# Patient Record
Sex: Female | Born: 1970 | Hispanic: Yes | Marital: Married | Smoking: Never smoker
Health system: Southern US, Community
[De-identification: ages and names within clinical notes are randomized; demographics above are authoritative.]

## PROBLEM LIST (undated history)

## (undated) DIAGNOSIS — D509 Iron deficiency anemia, unspecified: Secondary | ICD-10-CM

## (undated) DIAGNOSIS — Z9889 Other specified postprocedural states: Secondary | ICD-10-CM

## (undated) DIAGNOSIS — E119 Type 2 diabetes mellitus without complications: Secondary | ICD-10-CM

## (undated) DIAGNOSIS — K5010 Crohn's disease of large intestine without complications: Secondary | ICD-10-CM

## (undated) DIAGNOSIS — I10 Essential (primary) hypertension: Secondary | ICD-10-CM

## (undated) DIAGNOSIS — R112 Nausea with vomiting, unspecified: Secondary | ICD-10-CM

## (undated) HISTORY — PX: APPENDECTOMY: SHX54

## (undated) HISTORY — PX: CHOLECYSTECTOMY: SHX55

---

## 2020-12-02 ENCOUNTER — Emergency Department (HOSPITAL_COMMUNITY)
Admission: EM | Admit: 2020-12-02 | Discharge: 2020-12-02 | Disposition: A | Discharge status: Home / Self Care | Payer: 59 | Attending: Emergency Medicine | Admitting: Emergency Medicine

## 2020-12-02 ENCOUNTER — Other Ambulatory Visit: Payer: Self-pay

## 2020-12-02 ENCOUNTER — Emergency Department (HOSPITAL_COMMUNITY): Payer: 59

## 2020-12-02 ENCOUNTER — Encounter (HOSPITAL_COMMUNITY): Payer: Self-pay

## 2020-12-02 DIAGNOSIS — I10 Essential (primary) hypertension: Secondary | ICD-10-CM | POA: Insufficient documentation

## 2020-12-02 DIAGNOSIS — R519 Headache, unspecified: Secondary | ICD-10-CM | POA: Insufficient documentation

## 2020-12-02 LAB — BASIC METABOLIC PANEL
Anion gap: 9 (ref 5–15)
BUN: 11 mg/dL (ref 6–20)
CO2: 26 mmol/L (ref 22–32)
Calcium: 9.3 mg/dL (ref 8.9–10.3)
Chloride: 105 mmol/L (ref 98–111)
Creatinine, Ser: 0.88 mg/dL (ref 0.44–1.00)
GFR, Estimated: >60 mL/min (ref >60)
Glucose, Bld: 98 mg/dL (ref 70–99)
Potassium: 4 mmol/L (ref 3.5–5.1)
Sodium: 140 mmol/L (ref 135–145)

## 2020-12-02 LAB — CBC
HCT: 39.8 % (ref 36.0–46.0)
Hemoglobin: 13.5 g/dL (ref 12.0–15.0)
MCH: 28.2 pg (ref 26.0–34.0)
MCHC: 33.9 g/dL (ref 30.0–36.0)
MCV: 83.3 fL (ref 80.0–100.0)
Platelets: 362 10*3/uL (ref 150–400)
RBC: 4.78 MIL/uL (ref 3.87–5.11)
RDW: 14 % (ref 11.5–15.5)
WBC: 7.1 10*3/uL (ref 4.0–10.5)
nRBC: 0 % (ref 0.0–0.2)

## 2020-12-02 MED ORDER — AMLODIPINE BESYLATE 5 MG PO TABS: 5.0000 mg | ORAL_TABLET | Freq: Every day | ORAL | 1 refills | Status: DC

## 2020-12-02 MED ORDER — AMLODIPINE BESYLATE 5 MG PO TABS
5.0000 mg | ORAL_TABLET | Freq: Once | ORAL | Status: AC
  Administered 2020-12-02: 5 mg via ORAL
  Filled 2020-12-02: qty 1

## 2020-12-02 NOTE — ED Triage Notes (Signed)
Pt here for hypertension, no previous hx of htn. States she was supposed to get a tooth pulled yesterday and dentist would not do it d/t htn. Pt c/o mild sob

## 2020-12-02 NOTE — ED Provider Notes (Signed)
Karen Hebert   CSN: [id number] Arrival date & time: 12/02/20  1016     History Chief Complaint  Patient presents with  . Hypertension    Karen Hebert is a 50 y.o. female.  The history is provided by the patient, the spouse and medical records.  Karen Hebert is a 50 y.o. female who presents to the Emergency Department complaining of hypertension. She presents the emergency department accompanied by her husband for evaluation of hypertension. She sought an ophthalmologist last year and was told that she had some changes to her right eye and was scheduled to follow-up with a primary care provider and then she ended up moving and was unable to follow-up with the primary care provider. She believes that these were vessel changes to her eye. Yesterday she went to the dentist to have her tooth pulled when they checked her blood pressure and it was found to be high and she was recommended to get evaluated for hyper- pressure. She does report intermittent right-sided headaches that have been ongoing for several months, none currently. She also reports one week of occasional chest tightness, not currently. No vomiting, diarrhea, leg swelling or pain. She takes no medications.     History reviewed. No pertinent past medical history.  There are no problems to display for this patient.   Past Surgical History:  Procedure Laterality Date  . APPENDECTOMY    . CHOLECYSTECTOMY       OB History   No obstetric history on file.     No family history on file.  Social History   Tobacco Use  . Smoking status: Never Smoker  . Smokeless tobacco: Never Used  Substance Use Topics  . Alcohol use: Not Currently  . Drug use: Not Currently    Home Medications Prior to Admission medications   Medication Sig Start Date End Date Taking? Authorizing Provider  amLODipine (NORVASC) 5 MG tablet Take 1 tablet (5 mg total) by mouth daily. 12/02/20   Yes Tilden Fossa, MD    Allergies    Penicillins  Review of Systems   Review of Systems  All other systems reviewed and are negative.   Physical Exam Updated Vital Signs BP (!) 157/106   Pulse 64   Temp 98.2 F (36.8 C) (Oral)   Resp 18   Ht 5\' 9"  (1.753 m)   Wt 102.1 kg   SpO2 100%   BMI 33.23 kg/m   Physical Exam Vitals and nursing Hebert reviewed.  Constitutional:      Appearance: She is well-developed.  HENT:     Head: Normocephalic and atraumatic.  Cardiovascular:     Rate and Rhythm: Normal rate and regular rhythm.     Heart sounds: No murmur heard.   Pulmonary:     Effort: Pulmonary effort is normal. No respiratory distress.     Breath sounds: Normal breath sounds.  Abdominal:     Palpations: Abdomen is soft.     Tenderness: There is no abdominal tenderness. There is no guarding or rebound.  Musculoskeletal:        General: No tenderness.  Skin:    General: Skin is warm and dry.  Neurological:     Mental Status: She is alert and oriented to person, place, and time.     Comments: No asymmetry of facial movements. Five out of five strength in all four extremities  Psychiatric:        Behavior: Behavior normal.  ED Results / Procedures / Treatments   Labs (all labs ordered are listed, but only abnormal results are displayed) Labs Reviewed  CBC  BASIC METABOLIC PANEL    EKG EKG Interpretation  Date/Time:  Tuesday December 02 2020 12:00:18 EDT Ventricular Rate:  64 PR Interval:  173 QRS Duration: 88 QT Interval:  421 QTC Calculation: 435 R Axis:   118 Text Interpretation: Sinus rhythm Left posterior fascicular block Anterior infarct, old 12 Lead; Mason-Likar no prior available for comparison Confirmed by Tilden Fossa [phone]) on 12/02/2020 4:01:23 PM   Radiology CT Head Wo Contrast  Result Date: 12/02/2020 CLINICAL DATA:  Headache for 2 months.  Hypertension. EXAM: CT HEAD WITHOUT CONTRAST TECHNIQUE: Contiguous axial images were obtained  from the base of the skull through the vertex without intravenous contrast. COMPARISON:  None. FINDINGS: Brain: No evidence of large-territorial acute infarction. No parenchymal hemorrhage. No mass lesion. No extra-axial collection. No mass effect or midline shift. No hydrocephalus. Basilar cisterns are patent. Vascular: No hyperdense vessel. Skull: No acute fracture or focal lesion. Sinuses/Orbits: Mucosal thickening of the right maxillary sinus. Otherwise paranasal sinuses and mastoid air cells are clear. The orbits are unremarkable. Other: None. IMPRESSION: 1. No acute intracranial abnormality. 2. Mucosal thickening of the right maxillary sinus. Electronically Signed   By: Tish Frederickson M.D.   On: 12/02/2020 17:13    Procedures Procedures   Medications Ordered in ED Medications  amLODipine (NORVASC) tablet 5 mg (5 mg Oral Given 12/02/20 1500)    ED Course  I have reviewed the triage vital signs and the nursing notes.  Pertinent labs & imaging results that were available during my care of the patient were reviewed by me and considered in my medical decision making (see chart for details).    MDM Rules/Calculators/A&P                         patient here for evaluation of hypertension, she is asymptomatic on ED presentation but has been experiencing headaches recently as well as occasional chest tightness. EKG is without acute ischemic changes, lungs are clear on examination. She has no focal neurologic deficits. CT scan without acute intracranial abnormality. Will start amlodipine for hypertension. Discussed importance of following up with PCP as well as return precautions.  Presentation is not consistent with hypertensive urgency, ACS.  Final Clinical Impression(s) / ED Diagnoses Final diagnoses:  Essential hypertension    Rx / DC Orders ED Discharge Orders         Ordered    amLODipine (NORVASC) 5 MG tablet  Daily        12/02/20 1725           Tilden Fossa, MD 12/02/20  2011

## 2020-12-02 NOTE — ED Provider Notes (Signed)
MSE was initiated and I personally evaluated the patient and placed orders (if any) at  10:55 AM on December 02, 2020.  Pt presents with asymptomatic hypertension.  Pt went to a dentist and BP was very elevated. Pt does not have a history of htn.  No complaints of chest pain, shortness of breath.  On exam, lungs clear.  No focal neuro deficits.  BP elevated.  Will check screening labs.  The patient appears stable so that the remainder of the MSE may be completed by another provider.   Linwood Dibbles, MD 12/02/20 1057

## 2020-12-22 ENCOUNTER — Ambulatory Visit (HOSPITAL_BASED_OUTPATIENT_CLINIC_OR_DEPARTMENT_OTHER): Payer: 59 | Admitting: Nurse Practitioner

## 2020-12-25 ENCOUNTER — Other Ambulatory Visit: Payer: Self-pay

## 2020-12-25 ENCOUNTER — Ambulatory Visit (INDEPENDENT_AMBULATORY_CARE_PROVIDER_SITE_OTHER): Payer: 59 | Admitting: Nurse Practitioner

## 2020-12-25 ENCOUNTER — Encounter (HOSPITAL_BASED_OUTPATIENT_CLINIC_OR_DEPARTMENT_OTHER): Payer: Self-pay | Admitting: Nurse Practitioner

## 2020-12-25 VITALS — BP 145/85 | HR 69 | Ht 69.0 in | Wt 236.2 lb

## 2020-12-25 DIAGNOSIS — E1159 Type 2 diabetes mellitus with other circulatory complications: Secondary | ICD-10-CM | POA: Insufficient documentation

## 2020-12-25 DIAGNOSIS — R601 Generalized edema: Secondary | ICD-10-CM

## 2020-12-25 DIAGNOSIS — Z7689 Persons encountering health services in other specified circumstances: Secondary | ICD-10-CM

## 2020-12-25 DIAGNOSIS — E669 Obesity, unspecified: Secondary | ICD-10-CM | POA: Insufficient documentation

## 2020-12-25 DIAGNOSIS — Z6834 Body mass index (BMI) 34.0-34.9, adult: Secondary | ICD-10-CM

## 2020-12-25 DIAGNOSIS — R609 Edema, unspecified: Secondary | ICD-10-CM

## 2020-12-25 DIAGNOSIS — I152 Hypertension secondary to endocrine disorders: Secondary | ICD-10-CM | POA: Insufficient documentation

## 2020-12-25 DIAGNOSIS — Z0000 Encounter for general adult medical examination without abnormal findings: Secondary | ICD-10-CM | POA: Diagnosis not present

## 2020-12-25 DIAGNOSIS — I10 Essential (primary) hypertension: Secondary | ICD-10-CM | POA: Diagnosis not present

## 2020-12-25 DIAGNOSIS — R5382 Chronic fatigue, unspecified: Secondary | ICD-10-CM

## 2020-12-25 DIAGNOSIS — R635 Abnormal weight gain: Secondary | ICD-10-CM

## 2020-12-25 HISTORY — DX: Persons encountering health services in other specified circumstances: Z76.89

## 2020-12-25 HISTORY — DX: Edema, unspecified: R60.9

## 2020-12-25 HISTORY — DX: Encounter for general adult medical examination without abnormal findings: Z00.00

## 2020-12-25 MED ORDER — AMLODIPINE BESYLATE 5 MG PO TABS: 5.0000 mg | ORAL_TABLET | Freq: Every day | ORAL | 0 refills | Status: DC

## 2020-12-25 MED ORDER — SPIRONOLACTONE 25 MG PO TABS: 25.0000 mg | ORAL_TABLET | Freq: Every day | ORAL | 0 refills | Status: DC

## 2020-12-25 NOTE — Assessment & Plan Note (Signed)
CPE in next couple of weeks.  Will get labs today and discuss at visit.

## 2020-12-25 NOTE — Assessment & Plan Note (Signed)
Long history of fluid retention in hands and feet. Formerly on spironolactone which was helpful for this problem. Ran out last year with no refills due to move.  Will plan to restart spironolocatone today at 25mg . Dosage titration may be needed for optimal management. Will work to receive previous records. No alarm signs present today- lungs clear and no gross edema noted.  Follow-up in next couple of weeks with CPE

## 2020-12-25 NOTE — Assessment & Plan Note (Addendum)
Significant blood pressure elevation noted at 220/119 with recent emergency room visit and start on amlodipine. BP today 145/85.  No alarm signs present on evaluation.  Improvement on BP, but not completely where we want it yet.  Hx of fluid retention, was formerly on spironolactone to help with this. Will restart spironolactone to see if this helps with BP and fluid. Continue amlodipine 5mg  at this time.   Will check labs today Recheck in next couple of weeks with CPE.

## 2020-12-25 NOTE — Assessment & Plan Note (Signed)
Weight gain over the past year despite daily exercise and strict dietary habits.  Will obtain labs today to evaluate for pathological dysfunction that could contribute.  Will discuss further at CPE in next couple of weeks.

## 2020-12-25 NOTE — Patient Instructions (Addendum)
Call to schedule the lab    Thank you for choosing [facility] for your Primary Care needs. I am excited for the opportunity to partner with you to meet your health care goals. It was a pleasure meeting you today!  I am an Adult-Geriatric Nurse Practitioner with a background in caring for patients for more than 20 years. I received my Paediatric nurse in Nursing and my Doctor of Nursing Practice degrees at [employer]. I received additional fellowship training in primary care and sports medicine after receiving my doctorate degree. I provide primary care and sports medicine services to patients age 70 and older within this office. I am also a provider with the [facility] and the director of the APP Fellowship with [facility].  I am a [place] native, but have called the [place] area home for nearly 20 years and am proud to be a member of this community.   I am passionate about providing the best service to you through preventive medicine and supportive care. I consider you a part of the medical team and value your input. I work diligently to ensure that you are heard and your needs are met in a safe and effective manner. I want you to feel comfortable with me as your provider and want you to know that your health concerns are important to me.   For your information, our office hours are Monday- Friday 8:00 AM - 5:00 PM At this time I am not in the office on Wednesdays.  If you have questions or concerns, please call our office at [phone] or send [place] a MyChart message and we will respond as quickly as possible.   For all urgent or time sensitive needs we ask that you please call the office to avoid delays. MyChart is not constantly monitored and replies may take up to 72 business hours.  MyChart Policy: . MyChart allows for you to see your visit notes, after visit summary, provider recommendations, lab and tests  results, make an appointment, request refills, and contact your provider or the office for non-urgent questions or concerns.  . Providers are seeing patients during normal business hours and do not have built in time to review MyChart messages. We ask that you allow a minimum of 72 business hours for MyChart message responses.  . Complex MyChart concerns may require a visit. Your provider may request you schedule a virtual or in person visit to ensure we are providing the best care possible. . MyChart messages sent after 4:00 PM on Friday will not be received by the provider until Monday morning.    Lab and Test Results: . You will receive your lab and test results on MyChart as soon as they are completed and results have been sent by the lab or testing facility. Due to this service, you will receive your results BEFORE your provider.  . Please allow a minimum of 72 business hours for your provider to receive and review lab and test results and contact you about.   . Most lab and test result comments from the provider will be sent through [facility]. Your provider may recommend changes to the plan of care, follow-up visits, repeat testing, ask questions, or request an office visit to discuss these results. You may reply directly to this message or call the office at [phone] to provide information for the provider or set up an appointment. . In some instances, you will be  called with test results and recommendations. Please let us know if this is preferred and we will make note of this in your chart to provide this for you.    . If you have not heard a response to your lab or test results in 72 business hours, please call the office to let us know.   After Hours: . For all non-emergency after hours needs, please call the office at [phone] and select the option to reach the on-call provider service. On-call services are shared between multiple [facility] offices and therefore it will not be  possible to speak directly with your provider. On-call providers may provide medical advice and recommendations, but are unable to provide refills for maintenance medications.  . For all emergency or urgent medical needs after normal business hours, we recommend that you seek care at the closest Urgent Care or Emergency Department to ensure appropriate treatment in a timely manner.  [facility] has a 24 hour emergency room located on the ground floor for your convenience.    Please do not hesitate to reach out to [place] with concerns.   Thank you, again, for choosing me as your health care partner. I appreciate your trust and look forward to learning more about you.   SaraBeth Theta Leaf, DNP, AGNP-c ___________________________________________________________________________________  Hypertension, Adult High blood pressure (hypertension) is when the force of blood pumping through the arteries is too strong. The arteries are the blood vessels that carry blood from the heart throughout the body. Hypertension forces the heart to work harder to pump blood and may cause arteries to become narrow or stiff. Untreated or uncontrolled hypertension can cause a heart attack, heart failure, a stroke, kidney disease, and other problems. A blood pressure reading consists of a higher number over a lower number. Ideally, your blood pressure should be below 120/80. The first ("top") number is called the systolic pressure. It is a measure of the pressure in your arteries as your heart beats. The second ("bottom") number is called the diastolic pressure. It is a measure of the pressure in your arteries as the heart relaxes. What are the causes? The exact cause of this condition is not known. There are some conditions that result in or are related to high blood pressure. What increases the risk? Some risk factors for high blood pressure are under your control. The following factors may make you more  likely to develop this condition:  Smoking.  Having type 2 diabetes mellitus, high cholesterol, or both.  Not getting enough exercise or physical activity.  Being overweight.  Having too much fat, sugar, calories, or salt (sodium) in your diet.  Drinking too much alcohol. Some risk factors for high blood pressure may be difficult or impossible to change. Some of these factors include:  Having chronic kidney disease.  Having a family history of high blood pressure.  Age. Risk increases with age.  Race. You may be at higher risk if you are African American.  Gender. Men are at higher risk than women before age 65. After age 59, women are at higher risk than men.  Having obstructive sleep apnea.  Stress. What are the signs or symptoms? High blood pressure may not cause symptoms. Very high blood pressure (hypertensive crisis) may cause:  Headache.  Anxiety.  Shortness of breath.  Nosebleed.  Nausea and vomiting.  Vision changes.  Severe chest pain.  Seizures. How is this diagnosed? This condition is diagnosed by measuring your blood pressure while  you are seated, with your arm resting on a flat surface, your legs uncrossed, and your feet flat on the floor. The cuff of the blood pressure monitor will be placed directly against the skin of your upper arm at the level of your heart. It should be measured at least twice using the same arm. Certain conditions can cause a difference in blood pressure between your right and left arms. Certain factors can cause blood pressure readings to be lower or higher than normal for a short period of time:  When your blood pressure is higher when you are in a health care provider's office than when you are at home, this is called white coat hypertension. Most people with this condition do not need medicines.  When your blood pressure is higher at home than when you are in a health care provider's office, this is called masked  hypertension. Most people with this condition may need medicines to control blood pressure. If you have a high blood pressure reading during one visit or you have normal blood pressure with other risk factors, you may be asked to:  Return on a different day to have your blood pressure checked again.  Monitor your blood pressure at home for 1 week or longer. If you are diagnosed with hypertension, you may have other blood or imaging tests to help your health care provider understand your overall risk for other conditions. How is this treated? This condition is treated by making healthy lifestyle changes, such as eating healthy foods, exercising more, and reducing your alcohol intake. Your health care provider may prescribe medicine if lifestyle changes are not enough to get your blood pressure under control, and if:  Your systolic blood pressure is above 130.  Your diastolic blood pressure is above 80. Your personal target blood pressure may vary depending on your medical conditions, your age, and other factors. Follow these instructions at home: Eating and drinking  Eat a diet that is high in fiber and potassium, and low in sodium, added sugar, and fat. An example eating plan is called the DASH (Dietary Approaches to Stop Hypertension) diet. To eat this way: ? Eat plenty of fresh fruits and vegetables. Try to fill one half of your plate at each meal with fruits and vegetables. ? Eat whole grains, such as whole-wheat pasta, brown rice, or whole-grain bread. Fill about one fourth of your plate with whole grains. ? Eat or drink low-fat dairy products, such as skim milk or low-fat yogurt. ? Avoid fatty cuts of meat, processed or cured meats, and poultry with skin. Fill about one fourth of your plate with lean proteins, such as fish, chicken without skin, beans, eggs, or tofu. ? Avoid pre-made and processed foods. These tend to be higher in sodium, added sugar, and fat.  Reduce your daily sodium  intake. Most people with hypertension should eat less than 1,500 mg of sodium a day.  Do not drink alcohol if: ? Your health care provider tells you not to drink. ? You are pregnant, may be pregnant, or are planning to become pregnant.  If you drink alcohol: ? Limit how much you use to:  0-1 drink a day for women.  0-2 drinks a day for men. ? Be aware of how much alcohol is in your drink. In the [place] one drink equals one 12 oz bottle of beer (355 mL), one 5 oz glass of wine (148 mL), or one 1 oz glass of hard liquor (44 mL).  Lifestyle  Work with your health care provider to maintain a healthy body weight or to lose weight. Ask what an ideal weight is for you.  Get at least 30 minutes of exercise most days of the week. Activities may include walking, swimming, or biking.  Include exercise to strengthen your muscles (resistance exercise), such as Pilates or lifting weights, as part of your weekly exercise routine. Try to do these types of exercises for 30 minutes at least 3 days a week.  Do not use any products that contain nicotine or tobacco, such as cigarettes, e-cigarettes, and chewing tobacco. If you need help quitting, ask your health care provider.  Monitor your blood pressure at home as told by your health care provider.  Keep all follow-up visits as told by your health care provider. This is important.   Medicines  Take over-the-counter and prescription medicines only as told by your health care provider. Follow directions carefully. Blood pressure medicines must be taken as prescribed.  Do not skip doses of blood pressure medicine. Doing this puts you at risk for problems and can make the medicine less effective.  Ask your health care provider about side effects or reactions to medicines that you should watch for. Contact a health care provider if you:  Think you are having a reaction to a medicine you are taking.  Have headaches that keep coming back  (recurring).  Feel dizzy.  Have swelling in your ankles.  Have trouble with your vision. Get help right away if you:  Develop a severe headache or confusion.  Have unusual weakness or numbness.  Feel faint.  Have severe pain in your chest or abdomen.  Vomit repeatedly.  Have trouble breathing. Summary  Hypertension is when the force of blood pumping through your arteries is too strong. If this condition is not controlled, it may put you at risk for serious complications.  Your personal target blood pressure may vary depending on your medical conditions, your age, and other factors. For most people, a normal blood pressure is less than 120/80.  Hypertension is treated with lifestyle changes, medicines, or a combination of both. Lifestyle changes include losing weight, eating a healthy, low-sodium diet, exercising more, and limiting alcohol. This information is not intended to replace advice given to you by your health care provider. Make sure you discuss any questions you have with your health care provider. Document Revised: 04/26/2018 Document Reviewed: 04/26/2018 Elsevier Patient Education  2021 [employer].

## 2020-12-25 NOTE — Assessment & Plan Note (Addendum)
>>  ASSESSMENT AND PLAN FOR OBESITY (BMI 30-39.9) WRITTEN ON 12/25/2020 10:05 AM BY Rox Mcgriff E, NP  BMI 34.88 today.  Patient exercises daily and has strict healthy dietary habits.  Will obtain labs today to evaluate for cause of weight gain.  Plan to discuss further at CPE in next few weeks.    >>ASSESSMENT AND PLAN FOR WEIGHT GAIN WRITTEN ON 12/25/2020 10:02 AM BY Laurena Valko E, NP  Weight gain over the past year despite daily exercise and strict dietary habits.  Will obtain labs today to evaluate for pathological dysfunction that could contribute.  Will discuss further at CPE in next couple of weeks.

## 2020-12-25 NOTE — Assessment & Plan Note (Signed)
Increased fatigue with no known etiology. Also concerns with weight gain and new diagnosis of hypertension.  Sleeping well at night.  Will obtain labs today to evaluate for possible cause.  Plan to discuss further at CPE in next couple of weeks.

## 2020-12-25 NOTE — Assessment & Plan Note (Signed)
New patient to establish care.  Review of current and past medical history.  No family history know as patient was adopted.  New diagnosis of HTN recently.  Last CPE in March/April of 2021 with former GYN in [place], [place]- will try to obtain records.  Will plan for follow-up CPE in next couple of weeks.  Labs for CPE ordered today.

## 2020-12-25 NOTE — Progress Notes (Signed)
New Patient Office Visit  Subjective:  Patient ID: Karen Hebert, female    DOB: 04/21/1971  Age: 50 y.o. MRN: [id number]  CC:  Chief Complaint  Patient presents with  . Establish Care    HPI Karen Hebert is a pleasant 50 year old female who presents today to establish care with this practice. She was formerly seen by Dr. Nadyne Coombes in [place], [place] for [facility] services. No recent PCP. She moved to the area and is need of primary care.   Concerns today: HTN Fatigue Weight Gain  HTN She reports that for the past few months she has been experiencing daily right side headaches with no known etiology. She went to the dentist for evaluation of broken tooth and her blood pressure was elevated. She tells me the dentist recommended that she get a blood pressure cuff to monitor her levels at home and seek care for elevated readings. When she went to get the BP cuff she took her blood pressure at [facility] and her readings were shown to be 204/109. She immediately went to the emergency room for evaluation where her initial blood pressure readings were 220/119. She had a complete work-up which was negative for intracranial or cardiac complications. They started her on amlodipine 5mg  and told her to follow-up with PCP.  She tells me that since she has started the amlodipine her headaches have resolved. She denies any vision changes, palpitations, dizziness, or chest pain. She is unsure of family history, as she was adopted, but she endorses a healthy diet and day workouts at the gym. She denies caffeine or other stimulant use. She does report that she has a history of fluid retention and was previously on spironolactone for this from her OB GYN. After her move she ran out of medication and this is when the headaches first started.   Fatigue and Weight Gain She tells me that she has been experiencing increased fatigue and weight gain over the past 6-8 months. She states that she follows a very  healthy diet with only water consumption and home cooked meals. She endorses oatmeal at breakfast without excess sugar, a vegetable smoothie at lunch without added sugars, and a balanced dinner. She goes to the gym daily and has a very physically active lifestyle. She reports that she sleeps well at night, but is constantly feeling tired. She does not know her family history of medical problems as she was adopted. She has not had a laboratory evaluation in at least the past year. She does report that she has tried several diet medications in the past which worked for her while she was taking them, but she was unable to maintain the weight loss.  She denies increased anxiety, palpitations, diarrhea, hair loss, or intolerance to cold. She denies excessive thirst or hunger.   History reviewed. No pertinent past medical history.  Past Surgical History:  Procedure Laterality Date  . APPENDECTOMY    . CHOLECYSTECTOMY      ROS Review of Systems All review of systems negative except what is listed in the HPI  Objective:   Today's Vitals: BP (!) 145/85   Pulse 69   Ht 5\' 9"  (1.753 m)   Wt 236 lb 3.2 oz (107.1 kg)   SpO2 100%   BMI 34.88 kg/m   Physical Exam Vitals and nursing note reviewed.  Constitutional:      Appearance: Normal appearance.  HENT:     Head: Normocephalic and atraumatic.  Eyes:  Extraocular Movements: Extraocular movements intact.     Conjunctiva/sclera: Conjunctivae normal.     Pupils: Pupils are equal, round, and reactive to light.  Neck:     Vascular: No carotid bruit.  Cardiovascular:     Rate and Rhythm: Normal rate and regular rhythm.     Pulses: Normal pulses.     Heart sounds: Normal heart sounds. No murmur heard.     Comments: Mild edema noted bilaterally in hands and feet consistent with fluid retention. No pitting, no alterations in skin coloration, no wounds, no weeping present.  Pulmonary:     Effort: Pulmonary effort is normal.     Breath sounds:  Normal breath sounds.  Abdominal:     General: Abdomen is flat. There is no distension.     Palpations: Abdomen is soft.  Musculoskeletal:        General: Normal range of motion.     Cervical back: Normal range of motion and neck supple. No rigidity.     Right lower leg: 1+ Edema present.     Left lower leg: 1+ Edema present.  Skin:    General: Skin is warm and dry.     Capillary Refill: Capillary refill takes less than 2 seconds.  Neurological:     General: No focal deficit present.     Mental Status: She is alert and oriented to person, place, and time.     Sensory: No sensory deficit.     Motor: No weakness.     Gait: Gait normal.  Psychiatric:        Mood and Affect: Mood normal.        Behavior: Behavior normal.        Thought Content: Thought content normal.        Judgment: Judgment normal.     Assessment & Plan:   Problem List Items Addressed This Visit      Cardiovascular and Mediastinum   Primary hypertension    Significant blood pressure elevation noted at 220/119 with recent emergency room visit and start on amlodipine. BP today 145/85.  No alarm signs present on evaluation.  Improvement on BP, but not completely where we want it yet.  Hx of fluid retention, was formerly on spironolactone to help with this. Will restart spironolactone to see if this helps with BP and fluid. Continue amlodipine 5mg  at this time.   Will check labs today Recheck in next couple of weeks with CPE.       Relevant Medications   spironolactone (ALDACTONE) 25 MG tablet   amLODipine (NORVASC) 5 MG tablet     Other   Encounter to establish care - Primary    New patient to establish care.  Review of current and past medical history.  No family history know as patient was adopted.  New diagnosis of HTN recently.  Last CPE in March/April of 2021 with former GYN in [place], [facility]- will try to obtain records.  Will plan for follow-up CPE in next couple of weeks.  Labs for CPE ordered  today.       Fluid retention in tissues    Long history of fluid retention in hands and feet. Formerly on spironolactone which was helpful for this problem. Ran out last year with no refills due to move.  Will plan to restart spironolocatone today at 25mg . Dosage titration may be needed for optimal management. Will work to receive previous records. No alarm signs present today- lungs clear and no gross edema noted.  Follow-up in next couple of weeks with CPE      Relevant Medications   spironolactone (ALDACTONE) 25 MG tablet   Laboratory tests ordered as part of a complete physical exam (CPE)    CPE in next couple of weeks.  Will get labs today and discuss at visit.       Weight gain    Weight gain over the past year despite daily exercise and strict dietary habits.  Will obtain labs today to evaluate for pathological dysfunction that could contribute.  Will discuss further at CPE in next couple of weeks.       Chronic fatigue    Increased fatigue with no known etiology. Also concerns with weight gain and new diagnosis of hypertension.  Sleeping well at night.  Will obtain labs today to evaluate for possible cause.  Plan to discuss further at CPE in next couple of weeks.       Body mass index (BMI) of 34.0-34.9 in adult    BMI 34.88 today.  Patient exercises daily and has strict healthy dietary habits.  Will obtain labs today to evaluate for cause of weight gain.  Plan to discuss further at CPE in next few weeks.          Outpatient Encounter Medications as of 12/25/2020  Medication Sig  . Multiple Vitamin (MULTIVITAMIN WITH MINERALS) TABS tablet Take 1 tablet by mouth daily.  Marland Kitchen spironolactone (ALDACTONE) 25 MG tablet Take 1 tablet (25 mg total) by mouth daily.  Marland Kitchen amLODipine (NORVASC) 5 MG tablet Take 1 tablet (5 mg total) by mouth daily.  . [DISCONTINUED] amLODipine (NORVASC) 5 MG tablet Take 1 tablet (5 mg total) by mouth daily.   No facility-administered encounter  medications on file as of 12/25/2020.    Follow-up: Return for CPE in a couple of weeks- will call for lab appt.   Tollie Eth, NP

## 2021-01-27 ENCOUNTER — Other Ambulatory Visit: Payer: Self-pay

## 2021-01-27 ENCOUNTER — Other Ambulatory Visit (HOSPITAL_BASED_OUTPATIENT_CLINIC_OR_DEPARTMENT_OTHER)
Admission: RE | Admit: 2021-01-27 | Discharge: 2021-01-27 | Disposition: A | Discharge status: Home / Self Care | Payer: 59 | Source: Ambulatory Visit | Attending: Nurse Practitioner | Admitting: Nurse Practitioner

## 2021-01-27 DIAGNOSIS — R601 Generalized edema: Secondary | ICD-10-CM | POA: Diagnosis present

## 2021-01-27 DIAGNOSIS — R635 Abnormal weight gain: Secondary | ICD-10-CM | POA: Insufficient documentation

## 2021-01-27 DIAGNOSIS — Z0000 Encounter for general adult medical examination without abnormal findings: Secondary | ICD-10-CM | POA: Insufficient documentation

## 2021-01-27 DIAGNOSIS — I10 Essential (primary) hypertension: Secondary | ICD-10-CM | POA: Insufficient documentation

## 2021-01-27 DIAGNOSIS — R5382 Chronic fatigue, unspecified: Secondary | ICD-10-CM | POA: Diagnosis present

## 2021-01-27 LAB — CBC WITH DIFFERENTIAL/PLATELET
Abs Immature Granulocytes: 0.05 10*3/uL (ref 0.00–0.07)
Basophils Absolute: 0.1 10*3/uL (ref 0.0–0.1)
Basophils Relative: 1 %
Eosinophils Absolute: 0.2 10*3/uL (ref 0.0–0.5)
Eosinophils Relative: 2 %
HCT: 38 % (ref 36.0–46.0)
Hemoglobin: 13 g/dL (ref 12.0–15.0)
Immature Granulocytes: 1 %
Lymphocytes Relative: 28 %
Lymphs Abs: 2.6 10*3/uL (ref 0.7–4.0)
MCH: 28.7 pg (ref 26.0–34.0)
MCHC: 34.2 g/dL (ref 30.0–36.0)
MCV: 83.9 fL (ref 80.0–100.0)
Monocytes Absolute: 0.6 10*3/uL (ref 0.1–1.0)
Monocytes Relative: 7 %
Neutro Abs: 5.7 10*3/uL (ref 1.7–7.7)
Neutrophils Relative %: 61 %
Platelets: 266 10*3/uL (ref 150–400)
RBC: 4.53 MIL/uL (ref 3.87–5.11)
RDW: 13.7 % (ref 11.5–15.5)
WBC: 9.2 10*3/uL (ref 4.0–10.5)
nRBC: 0 % (ref 0.0–0.2)

## 2021-01-27 LAB — LIPID PANEL
Cholesterol: 196 mg/dL (ref 0–200)
HDL: 53 mg/dL (ref >40)
LDL Cholesterol: 72 mg/dL (ref 0–99)
Total CHOL/HDL Ratio: 3.7 RATIO
Triglycerides: 355 mg/dL — ABNORMAL HIGH (ref <150)
VLDL: 71 mg/dL — ABNORMAL HIGH (ref 0–40)

## 2021-01-27 LAB — COMPREHENSIVE METABOLIC PANEL
ALT: 29 U/L (ref 0–44)
AST: 19 U/L (ref 15–41)
Albumin: 4.2 g/dL (ref 3.5–5.0)
Alkaline Phosphatase: 84 U/L (ref 38–126)
Anion gap: 10 (ref 5–15)
BUN: 13 mg/dL (ref 6–20)
CO2: 24 mmol/L (ref 22–32)
Calcium: 9 mg/dL (ref 8.9–10.3)
Chloride: 104 mmol/L (ref 98–111)
Creatinine, Ser: 0.74 mg/dL (ref 0.44–1.00)
GFR, Estimated: >60 mL/min (ref >60)
Glucose, Bld: 131 mg/dL — ABNORMAL HIGH (ref 70–99)
Potassium: 4.1 mmol/L (ref 3.5–5.1)
Sodium: 138 mmol/L (ref 135–145)
Total Bilirubin: 0.2 mg/dL — ABNORMAL LOW (ref 0.3–1.2)
Total Protein: 6.9 g/dL (ref 6.5–8.1)

## 2021-01-28 LAB — HEMOGLOBIN A1C
Hgb A1c MFr Bld: 7.1 % — ABNORMAL HIGH (ref 4.8–5.6)
Mean Plasma Glucose: 157 mg/dL

## 2021-01-29 LAB — THYROID PANEL WITH TSH
Free Thyroxine Index: 1.3 (ref 1.2–4.9)
T3 Uptake Ratio: 18 % — ABNORMAL LOW (ref 24–39)
T4, Total: 7.1 ug/dL (ref 4.5–12.0)
TSH: 0.998 u[IU]/mL (ref 0.450–4.500)

## 2021-01-29 NOTE — Progress Notes (Signed)
Please notify patient:  Your hemoglobin A1c is 7.1%- value of new diabetes.  Please schedule 40 minute New DM appt for education and discussion  Thyroid levels look good.   Overall cholesterol looks good, but triglycerides are significantly elevated which increase risk of pancreatitis. Often times this is due to elevated blood sugars, but we want to watch this and consider starting a low dose statin to help.   Kidney and liver function look good. Metabolic panel looks great aside from glucose.

## 2021-02-02 ENCOUNTER — Telehealth (HOSPITAL_BASED_OUTPATIENT_CLINIC_OR_DEPARTMENT_OTHER): Payer: Self-pay

## 2021-02-02 NOTE — Telephone Encounter (Signed)
Patient has an appointment on 02/03/21 at 8:50 am and will discuss lab results and recommendations at that time.

## 2021-02-02 NOTE — Telephone Encounter (Signed)
-----   Message from Tollie Eth, NP sent at 01/29/2021  8:01 AM EDT ----- Please notify patient:  Your hemoglobin A1c is 7.1%- value of new diabetes.  Please schedule 40 minute New DM appt for education and discussion  Thyroid levels look good.   Overall cholesterol looks good, but triglycerides are significantly elevated which increase risk of pancreatitis. Often times this is due to elevated blood sugars, but we want to watch this and consider starting a low dose statin to help.   Kidney and liver function look good. Metabolic panel looks great aside from glucose.

## 2021-02-03 ENCOUNTER — Encounter (HOSPITAL_BASED_OUTPATIENT_CLINIC_OR_DEPARTMENT_OTHER): Payer: Self-pay | Admitting: Nurse Practitioner

## 2021-02-03 ENCOUNTER — Other Ambulatory Visit: Payer: Self-pay

## 2021-02-03 ENCOUNTER — Ambulatory Visit (INDEPENDENT_AMBULATORY_CARE_PROVIDER_SITE_OTHER): Payer: 59 | Admitting: Nurse Practitioner

## 2021-02-03 VITALS — BP 132/82 | HR 64 | Ht 69.0 in | Wt 234.0 lb

## 2021-02-03 DIAGNOSIS — I152 Hypertension secondary to endocrine disorders: Secondary | ICD-10-CM | POA: Insufficient documentation

## 2021-02-03 DIAGNOSIS — Z6834 Body mass index (BMI) 34.0-34.9, adult: Secondary | ICD-10-CM | POA: Diagnosis not present

## 2021-02-03 DIAGNOSIS — E1165 Type 2 diabetes mellitus with hyperglycemia: Secondary | ICD-10-CM | POA: Diagnosis not present

## 2021-02-03 DIAGNOSIS — E782 Mixed hyperlipidemia: Secondary | ICD-10-CM

## 2021-02-03 DIAGNOSIS — E1169 Type 2 diabetes mellitus with other specified complication: Secondary | ICD-10-CM

## 2021-02-03 DIAGNOSIS — I10 Essential (primary) hypertension: Secondary | ICD-10-CM

## 2021-02-03 MED ORDER — OZEMPIC (0.25 OR 0.5 MG/DOSE) 2 MG/1.5ML ~~LOC~~ SOPN: 0.5000 mg | PEN_INJECTOR | SUBCUTANEOUS | 0 refills | Status: DC

## 2021-02-03 MED ORDER — SIMVASTATIN 5 MG PO TABS: 5.0000 mg | ORAL_TABLET | Freq: Every day | ORAL | 3 refills | Status: DC

## 2021-02-03 MED ORDER — BLOOD GLUCOSE METER KIT: PACK | 99 refills | Status: DC

## 2021-02-03 MED ORDER — ONDANSETRON HCL 8 MG PO TABS
8.0000 mg | ORAL_TABLET | Freq: Three times a day (TID) | ORAL | 2 refills | Status: DC | PRN
Start: 2021-02-03 — End: 2022-12-14

## 2021-02-03 NOTE — Assessment & Plan Note (Signed)
BP 132/82 today- significant improvement No alarm sx present Continue medications as prescribed- may need to increase amlodipine in the future F/U in 3 months

## 2021-02-03 NOTE — Assessment & Plan Note (Signed)
Simvastatin for HLD in the setting of new dx DM2.

## 2021-02-03 NOTE — Assessment & Plan Note (Signed)
New Dx DM2 Education provided on medications, diet, exercise, BG monitoring, sick days, associated care (eye, foot, kidney, immunizations). Pt declines Pna vaccine today- will readdress at future visit Given weight gain and CV risks, discussed GLP-1 as medication option- pt reports this is covered by her insurance plan Will start Ozempic 0.25mg  weekly and increase to 0.5mg  dose in 4 weeks.  F/U in 3 months

## 2021-02-03 NOTE — Assessment & Plan Note (Signed)
Starting semaglutide for DM-2 and weight management Patient encouraged to continue activity and dietary modifications F/U in 3 months

## 2021-02-03 NOTE — Progress Notes (Signed)
Established Patient Office Visit  Subjective:  Patient ID: Karen Hebert, female    DOB: 1971-07-09  Age: 50 y.o. MRN: [id number]  CC:  Chief Complaint  Patient presents with  . Follow-up    HPI Shaindel Sweeten presents for f/u for new DM and HLD.  Endorses she was told by her eye doctor that there were changes in her eyes, but denies any sx Endorses weight gain and fatigue   History reviewed. No pertinent past medical history.  Past Surgical History:  Procedure Laterality Date  . APPENDECTOMY    . CHOLECYSTECTOMY      History reviewed. No pertinent family history.  Social History   Socioeconomic History  . Marital status: Married    Spouse name: Not on file  . Number of children: Not on file  . Years of education: Not on file  . Highest education level: Not on file  Occupational History  . Not on file  Tobacco Use  . Smoking status: Never Smoker  . Smokeless tobacco: Never Used  Substance and Sexual Activity  . Alcohol use: Not Currently  . Drug use: Not Currently  . Sexual activity: Not on file  Other Topics Concern  . Not on file  Social History Narrative  . Not on file   Social Determinants of Health   Financial Resource Strain: Not on file  Food Insecurity: Not on file  Transportation Needs: Not on file  Physical Activity: Not on file  Stress: Not on file  Social Connections: Not on file  Intimate Partner Violence: Not on file    Outpatient Medications Prior to Visit  Medication Sig Dispense Refill  . amLODipine (NORVASC) 5 MG tablet Take 1 tablet (5 mg total) by mouth daily. 90 tablet 0  . Multiple Vitamin (MULTIVITAMIN WITH MINERALS) TABS tablet Take 1 tablet by mouth daily.    Marland Kitchen spironolactone (ALDACTONE) 25 MG tablet Take 1 tablet (25 mg total) by mouth daily. 90 tablet 0   No facility-administered medications prior to visit.    Allergies  Allergen Reactions  . Penicillins Anaphylaxis    ROS Review of Systems All review of  systems negative except what is listed in the HPI    Objective:    Physical Exam Vitals and nursing note reviewed.  Constitutional:      Appearance: Normal appearance. She is obese.  HENT:     Head: Normocephalic.  Eyes:     Extraocular Movements: Extraocular movements intact.     Conjunctiva/sclera: Conjunctivae normal.     Pupils: Pupils are equal, round, and reactive to light.  Cardiovascular:     Rate and Rhythm: Normal rate.  Pulmonary:     Effort: Pulmonary effort is normal.  Musculoskeletal:        General: Normal range of motion.     Cervical back: Normal range of motion.  Skin:    General: Skin is warm and dry.     Capillary Refill: Capillary refill takes less than 2 seconds.  Neurological:     General: No focal deficit present.     Mental Status: She is alert and oriented to person, place, and time.  Psychiatric:        Mood and Affect: Mood normal.        Behavior: Behavior normal.        Thought Content: Thought content normal.        Judgment: Judgment normal.     BP 132/82   Pulse 64  Ht 5' 9"  (1.753 m)   Wt 234 lb (106.1 kg)   SpO2 99%   BMI 34.56 kg/m  Wt Readings from Last 3 Encounters:  02/03/21 234 lb (106.1 kg)  12/25/20 236 lb 3.2 oz (107.1 kg)  12/02/20 225 lb (102.1 kg)     Health Maintenance Due  Topic Date Due  . PNEUMOCOCCAL POLYSACCHARIDE VACCINE AGE 105-64 HIGH RISK  Never done  . COVID-19 Vaccine (1) Never done  . Pneumococcal Vaccine 77-56 Years old (1 of 2 - PPSV23) Never done  . FOOT EXAM  Never done  . OPHTHALMOLOGY EXAM  Never done  . URINE MICROALBUMIN  Never done  . HIV Screening  Never done  . Hepatitis C Screening  Never done  . TETANUS/TDAP  Never done  . PAP SMEAR-Modifier  Never done  . COLONOSCOPY (Pts 45-65yr Insurance coverage will need to be confirmed)  Never done    There are no preventive care reminders to display for this patient.  Lab Results  Component Value Date   TSH 0.998 01/27/2021   Lab  Results  Component Value Date   WBC 9.2 01/27/2021   HGB 13.0 01/27/2021   HCT 38.0 01/27/2021   MCV 83.9 01/27/2021   PLT 266 01/27/2021   Lab Results  Component Value Date   NA 138 01/27/2021   K 4.1 01/27/2021   CO2 24 01/27/2021   GLUCOSE 131 (H) 01/27/2021   BUN 13 01/27/2021   CREATININE 0.74 01/27/2021   BILITOT 0.2 (L) 01/27/2021   ALKPHOS 84 01/27/2021   AST 19 01/27/2021   ALT 29 01/27/2021   PROT 6.9 01/27/2021   ALBUMIN 4.2 01/27/2021   CALCIUM 9.0 01/27/2021   ANIONGAP 10 01/27/2021   Lab Results  Component Value Date   CHOL 196 01/27/2021   Lab Results  Component Value Date   HDL 53 01/27/2021   Lab Results  Component Value Date   LDLCALC 72 01/27/2021   Lab Results  Component Value Date   TRIG 355 (H) 01/27/2021   Lab Results  Component Value Date   CHOLHDL 3.7 01/27/2021   Lab Results  Component Value Date   HGBA1C 7.1 (H) 01/27/2021      Assessment & Plan:   Problem List Items Addressed This Visit    Primary hypertension    BP 132/82 today- significant improvement No alarm sx present Continue medications as prescribed- may need to increase amlodipine in the future F/U in 3 months       Relevant Medications   simvastatin (ZOCOR) 5 MG tablet   Body mass index (BMI) of 34.0-34.9 in adult    Starting semaglutide for DM-2 and weight management Patient encouraged to continue activity and dietary modifications F/U in 3 months      Type 2 diabetes mellitus with hyperglycemia, without long-term current use of insulin (HCC) - Primary    New Dx DM2 Education provided on medications, diet, exercise, BG monitoring, sick days, associated care (eye, foot, kidney, immunizations). Pt declines Pna vaccine today- will readdress at future visit Given weight gain and CV risks, discussed GLP-1 as medication option- pt reports this is covered by her insurance plan Will start Ozempic 0.231mweekly and increase to 0.28m328mose in 4 weeks.  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       Relevant Medications   simvastatin (ZOCOR) 5 MG tablet   Semaglutide,0.25 or 0.5MG/DOS, (OZEMPIC, 0.25 OR 0.5 MG/DOSE,) 2 MG/1.5ML SOPN      Meds ordered this encounter  Medications  . simvastatin (ZOCOR) 5 MG tablet    Sig: Take 1 tablet (5 mg total) by mouth at bedtime.    Dispense:  90 tablet    Refill:  3  . blood glucose meter kit and supplies    Sig: Dispense based on patient and insurance preference. Use up to four times daily as directed. (FOR ICD-10 E10.9, E11.9).    Dispense:  1 each    Refill:  99    Order Specific Question:   Number of strips    Answer:   83    Order Specific Question:   Number of lancets    Answer:   99  . Semaglutide,0.25 or 0.5MG/DOS, (OZEMPIC, 0.25 OR 0.5 MG/DOSE,) 2 MG/1.5ML SOPN    Sig: Inject 0.5 mg into the skin once a week. Inject 0.107m into the skin once a week for 4 weeks, THEN increase to inject 0.583minto the skin weekly.    Dispense:  1.5 mL    Refill:  0  . ondansetron (ZOFRAN) 8 MG tablet    Sig: Take 1 tablet (8 mg total) by mouth every 8 (eight) hours as needed for nausea or vomiting. Take 1 tab (55m17mevery 8 hours as needed for nausea.    Dispense:  30 tablet    Refill:  2    Follow-up: Return in about 3 months (around 05/06/2021) for DM F/U.    SarOrma RenderP

## 2021-02-03 NOTE — Patient Instructions (Addendum)
Diabetes Mellitus Basics  Diabetes mellitus, or diabetes, is a long-term (chronic) disease. It occurs when the body does not properly use sugar (glucose) that is released from food after you eat. Diabetes mellitus may be caused by one or both of these problems:  Your pancreas does not make enough of a hormone called insulin.  Your body does not react in a normal way to the insulin that it makes. Insulin lets glucose enter cells in your body. This gives you energy. If you have diabetes, glucose cannot get into cells. This causes high blood glucose (hyperglycemia). How to treat and manage diabetes You may need to take insulin or other diabetes medicines daily to keep your glucose in balance. If you are prescribed insulin, you will learn how to give yourself insulin by injection. You may need to adjust the amount of insulin you take based on the foods that you eat. You will need to check your blood glucose levels using a glucose monitor as told by your health care provider. The readings can help determine if you have low or high blood glucose. Generally, you should have these blood glucose levels:  Before meals (preprandial): 80-130 mg/dL (6.3-8.4 mmol/L).  After meals (postprandial): below 180 mg/dL (10 mmol/L).  Hemoglobin A1c (HbA1c) level: less than 7%. Your health care provider will set treatment goals for you. Keep all follow-up visits. This is important. Follow these instructions at home: Diabetes medicines ? Metformin ? Start 500mg  with breakfast for 7 days, THEN 500mg  with breakfast and dinner for 7 days, Then 1000mg  with breakfast and dinner from then on. Managing blood glucose Check your blood glucose levels using a glucose monitor as told by your health care provider.  Write down the times that you check your glucose levels on the log provided at your visit in your packet.   Bring this log with you to your follow-up visits   Low blood glucose Low blood glucose (hypoglycemia)  is when glucose is at or below 70 mg/dL (3.9 mmol/L). Symptoms may include:  Feeling: ? Hungry. ? Sweaty and clammy. ? Irritable or easily upset. ? Dizzy. ? Sleepy.  Having: ? A fast heartbeat. ? A headache. ? A change in your vision. ? Numbness around the mouth, lips, or tongue.  Having trouble with: ? Moving (coordination). ? Sleeping. Treating low blood glucose To treat low blood glucose, eat or drink something containing sugar right away. If you can think clearly and swallow safely, follow the 15:15 rule:  Take 15 grams of a fast-acting carb (carbohydrate), as told by your health care provider.  Some fast-acting carbs are: ? Glucose tablets: take 3-4 tablets. ? Hard candy: eat 3-5 pieces. ? Fruit juice: drink 4 oz (120 mL). ? Regular (not diet) soda: drink 4-6 oz (120-180 mL). ? Honey or sugar: eat 1 Tbsp (15 mL).  Check your blood glucose levels 15 minutes after you take the carb.  If your glucose is still at or below 70 mg/dL (3.9 mmol/L), take 15 grams of a carb again.  If your glucose does not go above 70 mg/dL (3.9 mmol/L) after 3 tries, get help right away.  After your glucose goes back to normal, eat a meal or a snack within 1 hour. Treating very low blood glucose If your glucose is at or below 54 mg/dL (3 mmol/L), you have very low blood glucose (severe hypoglycemia). This is an emergency. Do not wait to see if the symptoms will go away. Get medical help right away. Call  your local emergency services (911 in the [place] Do not drive yourself to the hospital. Questions to ask your health care provider  Should I talk with a diabetes educator?  What equipment will I need to care for myself at home?  What diabetes medicines do I need? When should I take them?  How often do I need to check my blood glucose levels?  What number can I call if I have questions?  When is my follow-up visit?  Where can I find a support group for people with diabetes? Where to  find more information  American Diabetes Association: www.diabetes.org  Association of Diabetes Care and Education Specialists: www.diabeteseducator.org Contact a health care provider if:  Your blood glucose is at or above 240 mg/dL (24.2 mmol/L) for 2 days in a row.  You have been sick or have had a fever for 2 days or more, and you are not getting better.  You have any of these problems for more than 6 hours: ? You cannot eat or drink. ? You feel nauseous. ? You vomit. ? You have diarrhea. Get help right away if:  Your blood glucose is lower than 54 mg/dL (3 mmol/L).  You get confused.  You have trouble thinking clearly.  You have trouble breathing. These symptoms may represent a serious problem that is an emergency. Do not wait to see if the symptoms will go away. Get medical help right away. Call your local emergency services (911 in the [place] Do not drive yourself to the hospital. Summary  Diabetes mellitus is a chronic disease that occurs when the body does not properly use sugar (glucose) that is released from food after you eat.  Take insulin and diabetes medicines as told.  Check your blood glucose every day, as often as told.  Keep all follow-up visits. This is important. This information is not intended to replace advice given to you by your health care provider. Make sure you discuss any questions you have with your health care provider. Document Revised: 12/18/2019 Document Reviewed: 12/18/2019 Elsevier Patient Education  2021 Elsevier Inc.    Diabetes Mellitus and Exercise Exercising regularly is important for overall health, especially for people who have diabetes mellitus. Exercising is not only about losing weight. It has many other health benefits, such as increasing muscle strength and bone density and reducing body fat and stress. This leads to improved fitness, flexibility, and endurance, all of which result in better overall health. What are the  benefits of exercise if I have diabetes? Exercise has many benefits for people with diabetes. They include:  Helping to lower and control blood sugar (glucose).  Helping the body to respond better to the hormone insulin by improving insulin sensitivity.  Reducing how much insulin the body needs.  Lowering the risk for heart disease by: ? Lowering "bad" cholesterol and triglyceride levels. ? Increasing "good" cholesterol levels. ? Lowering blood pressure. ? Lowering blood glucose levels. What is my activity plan? Your health care provider or certified diabetes educator can help you make a plan for the type and frequency of exercise that works for you. This is called your activity plan. Be sure to:  Get at least 150 minutes of medium-intensity or high-intensity exercise each week. Exercises may include brisk walking, biking, or water aerobics.  Do stretching and strengthening exercises, such as yoga or weight lifting, at least 2 times a week.  Spread out your activity over at least 3 days of the week.  Get some form  of physical activity each day. ? Do not go more than 2 days in a row without some kind of physical activity. ? Avoid being inactive for more than 90 minutes at a time. Take frequent breaks to walk or stretch.  Choose exercises or activities that you enjoy. Set realistic goals.  Start slowly and gradually increase your exercise intensity over time.   How do I manage my diabetes during exercise? Monitor your blood glucose  Check your blood glucose before and after exercising. If your blood glucose is: ? 240 mg/dL (16.1 mmol/L) or higher before you exercise, check your urine for ketones. These are chemicals created by the liver. If you have ketones in your urine, do not exercise until your blood glucose returns to normal. ? 100 mg/dL (5.6 mmol/L) or lower, eat a snack containing 15-20 grams of carbohydrate. Check your blood glucose 15 minutes after the snack to make sure  that your glucose level is above 100 mg/dL (5.6 mmol/L) before you start your exercise.  Know the symptoms of low blood glucose (hypoglycemia) and how to treat it. Your risk for hypoglycemia increases during and after exercise. Follow these tips and your health care provider's instructions  Keep a carbohydrate snack that is fast-acting for use before, during, and after exercise to help prevent or treat hypoglycemia.  Avoid injecting insulin into areas of the body that are going to be exercised. For example, avoid injecting insulin into: ? Your arms, when you are about to play tennis. ? Your legs, when you are about to go jogging.  Keep records of your exercise habits. Doing this can help you and your health care provider adjust your diabetes management plan as needed. Write down: ? Food that you eat before and after you exercise. ? Blood glucose levels before and after you exercise. ? The type and amount of exercise you have done.  Work with your health care provider when you start a new exercise or activity. He or she may need to: ? Make sure that the activity is safe for you. ? Adjust your insulin, other medicines, and food that you eat.  Drink plenty of water while you exercise. This prevents loss of water (dehydration) and problems caused by a lot of heat in the body (heat stroke).   Where to find more information  American Diabetes Association: www.diabetes.org Summary  Exercising regularly is important for overall health, especially for people who have diabetes mellitus.  Exercising has many health benefits. It increases muscle strength and bone density and reduces body fat and stress. It also lowers and controls blood glucose.  Your health care provider or certified diabetes educator can help you make an activity plan for the type and frequency of exercise that works for you.  Work with your health care provider to make sure any new activity is safe for you. Also work with your  health care provider to adjust your insulin, other medicines, and the food you eat. This information is not intended to replace advice given to you by your health care provider. Make sure you discuss any questions you have with your health care provider. Document Revised: 05/14/2019 Document Reviewed: 05/14/2019 Elsevier Patient Education  2021 Elsevier Inc.   Diabetes Mellitus and Skin Care Diabetes, also called diabetes mellitus, can lead to skin problems. People with diabetes have a higher risk for many types of skin complications because poorly controlled blood sugar (glucose) levels can cause problems over time. These problems include:  Damage to nerves. This  can affect your ability to feel wounds, which means you may not notice minor skin injuries that could lead to serious problems. This can also decrease the amount that you sweat, causing dry skin that can break down.  Damage to blood vessels. The lack of blood flow can cause skin to break down and can slow healing time, which can lead to infections.  Areas of skin that become thick or discolored. Common skin conditions There are certain skin conditions that commonly affect people who have diabetes, such as:  Dry skin.  Thin skin. The skin on the feet may get thinner, break more easily, and heal more slowly than normal.  Bacterial skin infections, including: ? Styes. ? Boils. ? Infected hair follicles. ? Infections of the skin around the nails.  Fungal skin infections. These are most common in areas where skin rubs together, such as in the armpits or under the breasts. Common skin changes Diabetes can also cause the skin to change. You may develop:  Dark, velvety markings on the skin that usually appear on the face, neck, armpits, inner thighs, and groin.  Red, raised, scar-like tissue that may itch, feel painful, or develop into a wound.  Blisters on the feet, toes, hands, or fingers.  Thickened, wax-like areas of skin  that usually occur on the hands, forehead, or toes.  Brown or red, ring-shaped or half-ring-shaped patches of skin on the ears or fingers.  Pea-shaped, yellow bumps that may be itchy and surrounded by a red ring. This usually affects the arms, feet, buttocks, and the top of the hands.  Round, discolored patches of tan skin that do not hurt or itch. These may look like age spots. General tips Most skin problems can be prevented or treated easily if caught Karen Hebert. Talk with your health care provider if you have any concerns. General tips usually include:  Check your skin every day for cuts, bruises, redness, blisters, or sores, especially on your feet. Tell your health care provider about any cuts, wounds, or sores that you have and if they are healing slowly.  Keep your skin clean and dry. Do not use hot water.  Moisturize your skin to prevent chapping.  Do not scratch dry skin. Scratching dry skin can expose it to infection.  Keep your blood glucose levels within target range.   Supplies needed:  Mild soap or gentle skin cleanser.  Lotion. How to care for dry itchy skin It is common for people with diabetes to have itchy skin caused by dryness. Frequent high glucose levels can cause itchiness, and poor circulation and certain skin infections can make dry, itchy skin worse. If you have itchy skin that is red or covered in a rash, this could be a sign of an allergic reaction. If you have a rash or if your skin is very itchy, contact your health care provider. You may need help to manage your diabetes better, or you may need treatment for an infection. Untreated fungal infections can be dangerous because they allow more serious bacterial infections to enter the body. To relieve dry skin and itching:  Avoid very hot showers and baths.  Use mild soap and gentle skin cleansers. Do not use soap that is perfumed or harsh or that dries your skin. Moisturizing soaps may help.  Put on  moisturizing lotion as soon as you finish bathing. Follow these instructions at home:  Schedule a foot exam with your health care provider once every year. This exam includes an inspection  of the structure and skin of your feet.  Make sure that your health care provider performs a visual foot exam at every medical visit.  If you get a skin injury, such as a cut, blister, or sore, check the area every day for signs of infection. Check for: ? Redness, swelling, or pain. ? Fluid or blood. ? Warmth. ? Pus or a bad smell.  Do not use any products that contain nicotine or tobacco. These products include cigarettes, chewing tobacco, and vaping devices, such as e-cigarettes. If you need help quitting, ask your health care provider.  Take over-the-counter and prescription medicines only as told by your health care provider. This includes all diabetes medicines you are taking.   Where to find more information  American Diabetes Association: www.diabetes.org  Association of Diabetes Care & Education Specialists: www.diabeteseducator.org Contact a health care provider if you:  Develop a cut or sore, especially on your feet.  Develop signs of infection after a skin injury.  Have itchy skin that develops redness or a rash.  Have discolored areas of skin.  Have areas where your skin is changing, such as thickening or appearing shiny. Summary  People with diabetes have a higher risk for many types of skin complications. This is because poorly controlled blood sugar (glucose) levels can cause skin problems over time.  Most skin problems can be prevented or treated easily if caught Karen Hebert. Keep your blood glucose levels within target range.  Check your skin every day for cuts, bruises, redness, blisters, or sores, especially on your feet.  Tell your health care provider about any cuts, wounds, or sores that you have and if they are healing slowly. This information is not intended to replace  advice given to you by your health care provider. Make sure you discuss any questions you have with your health care provider. Document Revised: 03/06/2020 Document Reviewed: 03/06/2020 Elsevier Patient Education  2021 Elsevier Inc.   High Triglycerides Eating Plan Triglycerides are a type of fat in the blood. High levels of triglycerides can increase your risk of heart disease and stroke. If your triglyceride levels are high, choosing the right foods can help lower your triglycerides and keep your heart healthy. Work with your health care provider or a diet and nutrition specialist (dietitian) to develop an eating plan that is right for you. What are tips for following this plan? General guidelines  Lose weight, if you are overweight. For most people, losing 5-10 lbs (2-5 kg) helps lower triglyceride levels. A weight-loss plan may include. ? 30 minutes of exercise at least 5 days a week. ? Reducing the amount of calories, sugar, and fat you eat.  Eat a wide variety of fresh fruits, vegetables, and whole grains. These foods are high in fiber.  Eat foods that contain healthy fats, such as fatty fish, nuts, seeds, and olive oil.  Avoid foods that are high in added sugar, added salt (sodium), saturated fat, and trans fat.  Avoid low-fiber, refined carbohydrates such as white bread, crackers, noodles, and white rice.  Avoid foods with partially hydrogenated oils (trans fats), such as fried foods or stick margarine.  Limit alcohol intake to no more than 1 drink a day for nonpregnant women and 2 drinks a day for men. One drink equals 12 oz of beer, 5 oz of wine, or 1 oz of hard liquor. Your health care provider may recommend that you drink less depending on your overall health.   Reading food labels  Check food  labels for the amount of saturated fat. Choose foods with no or very little saturated fat.  Check food labels for the amount of trans fat. Choose foods with no trans fat.  Check  food labels for the amount of cholesterol. Choose foods low in cholesterol. Ask your dietitian how much cholesterol you should have each day.  Check food labels for the amount of sodium. Choose foods with less than 140 milligrams (mg) per serving. Shopping  Buy dairy products labeled as nonfat (skim) or low-fat (1%).  Avoid buying processed or prepackaged foods. These are often high in added sugar, sodium, and fat. Cooking  Choose healthy fats when cooking, such as olive oil or canola oil.  Cook foods using lower fat methods, such as baking, broiling, boiling, or grilling.  Make your own sauces, dressings, and marinades when possible, instead of buying them. Store-bought sauces, dressings, and marinades are often high in sodium and sugar. Meal planning  Eat more home-cooked food and less restaurant, buffet, and fast food.  Eat fatty fish at least 2 times each week. Examples of fatty fish include salmon, trout, mackerel, tuna, and herring.  If you eat whole eggs, do not eat more than 3 egg yolks per week. What foods are recommended? The items listed may not be a complete list. Talk with your dietitian about what dietary choices are best for you. Grains Whole wheat or whole grain breads, crackers, cereals, and pasta. Unsweetened oatmeal. Bulgur. Barley. Quinoa. Brown rice. Whole wheat flour tortillas. Vegetables Fresh or frozen vegetables. Low-sodium canned vegetables. Fruits All fresh, canned (in natural juice), or frozen fruits. Meats and other protein foods Skinless chicken or [place]. Ground chicken or [place]. Lean cuts of pork, trimmed of fat. Fish and seafood, especially salmon, trout, and herring. Egg whites. Dried beans, peas, or lentils. Unsalted nuts or seeds. Unsalted canned beans. Natural peanut or almond butter. Dairy Low-fat dairy products. Skim or low-fat (1%) milk. Reduced fat (2%) and low-sodium cheese. Low-fat ricotta cheese. Low-fat cottage cheese. Plain, low-fat  yogurt. Fats and oils Tub margarine without trans fats. Light or reduced-fat mayonnaise. Light or reduced-fat salad dressings. Avocado. Safflower, olive, sunflower, soybean, and canola oils. What foods are not recommended? The items listed may not be a complete list. Talk with your dietitian about what dietary choices are best for you. Grains White bread. White (regular) pasta. White rice. Cornbread. Bagels. Pastries. Crackers that contain trans fat. Vegetables Creamed or fried vegetables. Vegetables in a cheese sauce. Fruits Sweetened dried fruit. Canned fruit in syrup. Fruit juice. Meats and other protein foods Fatty cuts of meat. Ribs. Chicken wings. Karen Hebert. Sausage. Bologna. Salami. Chitterlings. Fatback. Hot dogs. Bratwurst. Packaged lunch meats. Dairy Whole or reduced-fat (2%) milk. Half-and-half. Cream cheese. Full-fat or sweetened yogurt. Full-fat cheese. Nondairy creamers. Whipped toppings. Processed cheese or cheese spreads. Cheese curds. Beverages Alcohol. Sweetened drinks, such as soda, lemonade, fruit drinks, or punches. Fats and oils Butter. Stick margarine. Lard. Shortening. Ghee. Bacon fat. Tropical oils, such as coconut, palm kernel, or palm oils. Sweets and desserts Corn syrup. Sugars. Honey. Molasses. Candy. Jam and jelly. Syrup. Sweetened cereals. Cookies. Pies. Cakes. Donuts. Muffins. Ice cream. Condiments Store-bought sauces, dressings, and marinades that are high in sugar, such as ketchup and barbecue sauce. Summary  High levels of triglycerides can increase the risk of heart disease and stroke. Choosing the right foods can help lower your triglycerides.  Eat plenty of fresh fruits, vegetables, and whole grains. Choose low-fat dairy and lean meats. Eat fatty fish  at least twice a week.  Avoid processed and prepackaged foods with added sugar, sodium, saturated fat, and trans fat.  If you need suggestions or have questions about what types of food are good for you,  talk with your health care provider or a dietitian. This information is not intended to replace advice given to you by your health care provider. Make sure you discuss any questions you have with your health care provider. Document Revised: 12/19/2019 Document Reviewed: 12/19/2019 Elsevier Patient Education  2021 Elsevier Inc.  Semaglutide injection solution What is this medicine? SEMAGLUTIDE (Sem a GLOO tide) is used to improve blood sugar control in adults with type 2 diabetes. This medicine may be used with other diabetes medicines. This drug may also reduce the risk of heart attack or stroke if you have type 2 diabetes and risk factors for heart disease. This medicine may be used for other purposes; ask your health care provider or pharmacist if you have questions. COMMON BRAND NAME(S): OZEMPIC What should I tell my health care provider before I take this medicine? They need to know if you have any of these conditions:  endocrine tumors (MEN 2) or if someone in your family had these tumors  eye disease, vision problems  history of pancreatitis  kidney disease  stomach problems  thyroid cancer or if someone in your family had thyroid cancer  an unusual or allergic reaction to semaglutide, other medicines, foods, dyes, or preservatives  pregnant or trying to get pregnant  breast-feeding How should I use this medicine? This medicine is for injection under the skin of your upper leg (thigh), stomach area, or upper arm. It is given once every week (every 7 days). You will be taught how to prepare and give this medicine. Use exactly as directed. Take your medicine at regular intervals. Do not take it more often than directed. If you use this medicine with insulin, you should inject this medicine and the insulin separately. Do not mix them together. Do not give the injections right next to each other. Change (rotate) injection sites with each injection. It is important that you put  your used needles and syringes in a special sharps container. Do not put them in a trash can. If you do not have a sharps container, call your pharmacist or healthcare provider to get one. A special MedGuide will be given to you by the pharmacist with each prescription and refill. Be sure to read this information carefully each time. This drug comes with INSTRUCTIONS FOR USE. Ask your pharmacist for directions on how to use this drug. Read the information carefully. Talk to your pharmacist or health care provider if you have questions. Talk to your pediatrician regarding the use of this medicine in children. Special care may be needed. Overdosage: If you think you have taken too much of this medicine contact a poison control center or emergency room at once. NOTE: This medicine is only for you. Do not share this medicine with others. What if I miss a dose? If you miss a dose, take it as soon as you can within 5 days after the missed dose. Then take your next dose at your regular weekly time. If it has been longer than 5 days after the missed dose, do not take the missed dose. Take the next dose at your regular time. Do not take double or extra doses. If you have questions about a missed dose, contact your health care provider for advice. What may interact with  this medicine?  other medicines for diabetes Many medications may cause changes in blood sugar, these include:  alcohol containing beverages  antiviral medicines for HIV or AIDS  aspirin and aspirin-like drugs  certain medicines for blood pressure, heart disease, irregular heart beat  chromium  diuretics  female hormones, such as estrogens or progestins, birth control pills  fenofibrate  gemfibrozil  isoniazid  lanreotide  female hormones or anabolic steroids  MAOIs like Carbex, Eldepryl, Marplan, Nardil, and Parnate  medicines for weight loss  medicines for allergies, asthma, cold, or cough  medicines for depression,  anxiety, or psychotic disturbances  niacin  nicotine  NSAIDs, medicines for pain and inflammation, like ibuprofen or naproxen  octreotide  pasireotide  pentamidine  phenytoin  probenecid  quinolone antibiotics such as ciprofloxacin, levofloxacin, ofloxacin  some herbal dietary supplements  steroid medicines such as prednisone or cortisone  sulfamethoxazole; trimethoprim  thyroid hormones Some medications can hide the warning symptoms of low blood sugar (hypoglycemia). You may need to monitor your blood sugar more closely if you are taking one of these medications. These include:  beta-blockers, often used for high blood pressure or heart problems (examples include atenolol, metoprolol, propranolol)  clonidine  guanethidine  reserpine This list may not describe all possible interactions. Give your health care provider a list of all the medicines, herbs, non-prescription drugs, or dietary supplements you use. Also tell them if you smoke, drink alcohol, or use illegal drugs. Some items may interact with your medicine. What should I watch for while using this medicine? Visit your doctor or health care professional for regular checks on your progress. Drink plenty of fluids while taking this medicine. Check with your doctor or health care professional if you get an attack of severe diarrhea, nausea, and vomiting. The loss of too much body fluid can make it dangerous for you to take this medicine. A test called the HbA1C (A1C) will be monitored. This is a simple blood test. It measures your blood sugar control over the last 2 to 3 months. You will receive this test every 3 to 6 months. Learn how to check your blood sugar. Learn the symptoms of low and high blood sugar and how to manage them. Always carry a quick-source of sugar with you in case you have symptoms of low blood sugar. Examples include hard sugar candy or glucose tablets. Make sure others know that you can choke if  you eat or drink when you develop serious symptoms of low blood sugar, such as seizures or unconsciousness. They must get medical help at once. Tell your doctor or health care professional if you have high blood sugar. You might need to change the dose of your medicine. If you are sick or exercising more than usual, you might need to change the dose of your medicine. Do not skip meals. Ask your doctor or health care professional if you should avoid alcohol. Many nonprescription cough and cold products contain sugar or alcohol. These can affect blood sugar. Pens should never be shared. Even if the needle is changed, sharing may result in passing of viruses like hepatitis or HIV. Wear a medical ID bracelet or chain, and carry a card that describes your disease and details of your medicine and dosage times. Do not become pregnant while taking this medicine. Women should inform their doctor if they wish to become pregnant or think they might be pregnant. There is a potential for serious side effects to an unborn child. Talk to your health  care professional or pharmacist for more information. What side effects may I notice from receiving this medicine? Side effects that you should report to your doctor or health care professional as soon as possible:  allergic reactions like skin rash, itching or hives, swelling of the face, lips, or tongue  breathing problems  changes in vision  diarrhea that continues or is severe  lump or swelling on the neck  severe nausea  signs and symptoms of infection like fever or chills; cough; sore throat; pain or trouble passing urine  signs and symptoms of low blood sugar such as feeling anxious, confusion, dizziness, increased hunger, unusually weak or tired, sweating, shakiness, cold, irritable, headache, blurred vision, fast heartbeat, loss of consciousness  signs and symptoms of kidney injury like trouble passing urine or change in the amount of urine  trouble  swallowing  unusual stomach upset or pain  vomiting Side effects that usually do not require medical attention (report to your doctor or health care professional if they continue or are bothersome):  constipation  diarrhea  nausea  pain, redness, or irritation at site where injected  stomach upset This list may not describe all possible side effects. Call your doctor for medical advice about side effects. You may report side effects to FDA at 1-800-FDA-1088. Where should I keep my medicine? Keep out of the reach of children. Store unopened pens in a refrigerator between 2 and 8 degrees C (36 and 46 degrees F). Do not freeze. Protect from light and heat. After you first use the pen, it can be stored for 56 days at room temperature between 15 and 30 degrees C (59 and 86 degrees F) or in a refrigerator. Throw away your used pen after 56 days or after the expiration date, whichever comes first. Do not store your pen with the needle attached. If the needle is left on, medicine may leak from the pen. NOTE: This sheet is a summary. It may not cover all possible information. If you have questions about this medicine, talk to your doctor, pharmacist, or health care provider.  2021 Elsevier/Gold Standard (2019-05-01 09:41:51)   Simvastatin Tablets What is this medicine? SIMVASTATIN (SIM va stat in) is a statin. It lowers bad cholesterol and triglyceride levels in the blood. It also increases good cholesterol levels. It is used with lifestyle changes, like diet and exercise. It may be used alone or with other drugs. This medicine may be used for other purposes; ask your health care provider or pharmacist if you have questions. COMMON BRAND NAME(S): Zocor What should I tell my health care provider before I take this medicine? They need to know if you have any of these conditions:  diabetes (high blood sugar)  if you often drink alcohol  kidney disease  liver disease  muscle cramps,  pain  stroke  thyroid disease  an unusual or allergic reaction to simvastatin, other medicines, foods, dyes, or preservatives  pregnant or trying to get pregnant  breast-feeding How should I use this medicine? Take this medicine by mouth. Take it as directed on the prescription label at the same time every day. You can take it with or without food. If it upsets your stomach, take it with food. Keep taking it unless your health care provider tells you to stop. Do not take this medicine with grapefruit juice. Talk to your health care provider about the use of this medicine in children. While it may be prescribed for children as young as 10 for selected  conditions, precautions do apply. Overdosage: If you think you have taken too much of this medicine contact a poison control center or emergency room at once. NOTE: This medicine is only for you. Do not share this medicine with others. What if I miss a dose? If you miss a dose, take it as soon as you can. If it is almost time for your next dose, take only that dose. Do not take double or extra doses. What may interact with this medicine? Do not take this medicine with any of the following medications:  antiviral medicines for HIV or AIDS  certain antibiotics like clarithromycin, erythromycin, telithromycin  certain medicines for fungal infections like ketoconazole, itraconazole, posaconazole, voriconazole  conivaptan  cyclosporine  danazol  gemfibrozil  idelalisib  mifepristone, RU-486  nefazodone  supplements like red yeast rice This medicine may also interact with the following medications:  alcohol  certain medicines for blood pressure or heart disease like amlodipine, diltiazem, verapamil  certain medicines for irregular heart beat like amiodarone and dronedarone  certain medicines that treat or prevent blood clots like warfarin  colchicine  digoxin  grapefruit juice  lomitapide  niacin  ranolazine This  list may not describe all possible interactions. Give your health care provider a list of all the medicines, herbs, non-prescription drugs, or dietary supplements you use. Also tell them if you smoke, drink alcohol, or use illegal drugs. Some items may interact with your medicine. What should I watch for while using this medicine? Visit your health care provider for regular checks on your progress. Tell your health care provider if your symptoms do not start to get better or if they get worse. Your health care provider may tell you to stop taking this medicine if you develop muscle problems. If your muscle problems do not go away after stopping this medicine, contact your health care provider. Do not become pregnant while taking this medicine. Women should inform their health care provider if they wish to become pregnant or think they might be pregnant. There is potential for serious harm to an unborn child. Talk to your health care provider for more information. Do not breast-feed an infant while taking this medicine. Birth control may not work properly while you are taking this medicine. Talk to your health care provider about using an extra method of birth control. This medicine may increase blood sugar. Ask your health care provider if changes in diet or medicines are needed if you have diabetes. If you are going to need surgery or other procedure, tell your health care provider that you are using this medicine. Taking this medicine is only part of a total heart healthy program. Your health care provider may give you a special diet to follow. Avoid alcohol. Avoid smoking. Ask your health care provider how much you should exercise. What side effects may I notice from receiving this medicine? Side effects that you should report to your doctor or health care provider as soon as possible:  allergic reactions (skin rash, itching or hives; swelling of the face, lips, or tongue)  confusion  depressed  mood  high blood sugar (increased hunger, thirst, or urination; unusually weak or tired, blurry vision)  infection (fever, chills, cough, sore throat, pain, or trouble passing urine)  joint pain  liver injury (dark yellow or brown urine; general ill feeling or flu-like symptoms; loss of appetite, right upper belly pain; unusually weak or tired, yellowing of the eyes or skin)  loss of memory  muscle injury (dark  urine; trouble passing urine or change in the amount of urine; unusually weak or tired; muscle pain; back pain)  redness, blistering, peeling, or loosening of the skin, including inside the mouth Side effects that usually do not require medical attention (report to your doctor or health care provider if they continue or are bothersome):  constipation  diarrhea  dizziness  headache  nausea  passing gas  stomach pain  trouble sleeping  upset stomach This list may not describe all possible side effects. Call your doctor for medical advice about side effects. You may report side effects to FDA at 1-800-FDA-1088. Where should I keep my medicine? Keep out of the reach of children and pets. Store at room temperature between 20 and 25 degrees C (68 and 77 degrees F). Get rid of any unused medicine after the expiration date. To get rid of medicines that are no longer needed or have expired:  Take the medicine to a medicine take-back program. Check with your pharmacy or law enforcement to find a location.  If you cannot return the medicine, check the label or package insert to see if the medicine should be thrown out in the garbage or flushed down the toilet. If you are not sure, ask your health care provider. If it is safe to put it in the trash, take the medicine out of the container. Mix the medicine with cat litter, dirt, coffee grounds, or other unwanted substance. Seal the mixture in a bag or container. Put it in the trash. NOTE: This sheet is a summary. It may not cover  all possible information. If you have questions about this medicine, talk to your doctor, pharmacist, or health care provider.  2021 Elsevier/Gold Standard (2020-06-28 17:35:36)

## 2021-02-27 ENCOUNTER — Other Ambulatory Visit (HOSPITAL_BASED_OUTPATIENT_CLINIC_OR_DEPARTMENT_OTHER): Payer: Self-pay | Admitting: Nurse Practitioner

## 2021-02-27 DIAGNOSIS — E1165 Type 2 diabetes mellitus with hyperglycemia: Secondary | ICD-10-CM

## 2021-03-12 ENCOUNTER — Other Ambulatory Visit (HOSPITAL_BASED_OUTPATIENT_CLINIC_OR_DEPARTMENT_OTHER): Payer: Self-pay | Admitting: Nurse Practitioner

## 2021-03-12 DIAGNOSIS — E1165 Type 2 diabetes mellitus with hyperglycemia: Secondary | ICD-10-CM

## 2021-03-12 MED ORDER — OZEMPIC (0.25 OR 0.5 MG/DOSE) 2 MG/1.5ML ~~LOC~~ SOPN: 0.5000 mg | PEN_INJECTOR | SUBCUTANEOUS | 3 refills | Status: DC

## 2021-03-21 ENCOUNTER — Other Ambulatory Visit (HOSPITAL_BASED_OUTPATIENT_CLINIC_OR_DEPARTMENT_OTHER): Payer: Self-pay | Admitting: Nurse Practitioner

## 2021-03-21 DIAGNOSIS — R601 Generalized edema: Secondary | ICD-10-CM

## 2021-03-21 DIAGNOSIS — I10 Essential (primary) hypertension: Secondary | ICD-10-CM

## 2021-03-31 ENCOUNTER — Other Ambulatory Visit (HOSPITAL_BASED_OUTPATIENT_CLINIC_OR_DEPARTMENT_OTHER): Payer: Self-pay | Admitting: Nurse Practitioner

## 2021-03-31 DIAGNOSIS — I10 Essential (primary) hypertension: Secondary | ICD-10-CM

## 2021-03-31 DIAGNOSIS — R601 Generalized edema: Secondary | ICD-10-CM

## 2021-03-31 MED ORDER — AMLODIPINE BESYLATE 5 MG PO TABS: 5.0000 mg | ORAL_TABLET | Freq: Every day | ORAL | 0 refills | Status: DC

## 2021-03-31 MED ORDER — SPIRONOLACTONE 25 MG PO TABS: 25.0000 mg | ORAL_TABLET | Freq: Every day | ORAL | 0 refills | Status: DC

## 2021-05-11 ENCOUNTER — Encounter (HOSPITAL_BASED_OUTPATIENT_CLINIC_OR_DEPARTMENT_OTHER): Payer: Self-pay | Admitting: Nurse Practitioner

## 2021-05-11 ENCOUNTER — Ambulatory Visit (INDEPENDENT_AMBULATORY_CARE_PROVIDER_SITE_OTHER): Payer: 59 | Admitting: Nurse Practitioner

## 2021-05-11 ENCOUNTER — Other Ambulatory Visit: Payer: Self-pay

## 2021-05-11 VITALS — BP 141/84 | HR 71 | Ht 69.0 in | Wt 234.4 lb

## 2021-05-11 DIAGNOSIS — E1165 Type 2 diabetes mellitus with hyperglycemia: Secondary | ICD-10-CM | POA: Diagnosis not present

## 2021-05-11 DIAGNOSIS — I10 Essential (primary) hypertension: Secondary | ICD-10-CM | POA: Diagnosis not present

## 2021-05-11 LAB — POCT UA - MICROALBUMIN
Albumin/Creatinine Ratio, Urine, POC: <30
Creatinine, POC: 300 mg/dL
Microalbumin Ur, POC: 30 mg/L

## 2021-05-11 MED ORDER — SEMAGLUTIDE (1 MG/DOSE) 4 MG/3ML ~~LOC~~ SOPN: 1.0000 mg | PEN_INJECTOR | SUBCUTANEOUS | 1 refills | Status: DC

## 2021-05-11 NOTE — Progress Notes (Signed)
Established Patient Office Visit  Subjective:  Patient ID: Karen Hebert, female    DOB: 04/11/1971  Age: 50 y.o. MRN: [id number]  CC:  Chief Complaint  Patient presents with   Follow-up    Patient states she is following up on her insulin.    HPI Karen Hebert presents for follow-up for DM.    Average about 136  DIABETES Current Medications: Ozempic 0.5 mg Medication Side Effects: None Taking medications as prescribed:  Yes  Glucose Monitoring: Yes   Frequency:  Daily  Average Fasting BG: 130s Blood Pressure Monitoring:  not checking  Hypoglycemic episodes:no Polydipsia/polyuria: no Visual changes: no Chest pain: no Paresthesias: no Diabetic Diet: yes Exercise:  Occasional  Retinal Examination: Due Now Foot Exam: Up to Date Urine Microalbumin: Due Today Pneumovax: unknown Influenza: unknown COVID:unknown  Last A1c: Current A1c:  Lab Results  Component Value Date   HGBA1C 7.1 (H) 01/27/2021   Weight:  Wt Readings from Last 3 Encounters:  05/11/21 234 lb 6.4 oz (106.3 kg)  02/03/21 234 lb (106.1 kg)  12/25/20 236 lb 3.2 oz (107.1 kg)   BMI: Estimated body mass index is 34.61 kg/m as calculated from the following:   Height as of this encounter: 5' 9"  (1.753 m).   Weight as of this encounter: 234 lb 6.4 oz (106.3 kg). Cholesterol:  Lab Results  Component Value Date   CHOL 196 01/27/2021   HDL 53 01/27/2021   LDLCALC 72 01/27/2021   TRIG 355 (H) 01/27/2021   CHOLHDL 3.7 01/27/2021   Kidney Fx:  Lab Results  Component Value Date   CREATININE 0.74 01/27/2021   BUN 13 01/27/2021   NA 138 01/27/2021   K 4.1 01/27/2021   CL 104 01/27/2021   CO2 24 01/27/2021   BP:  BP Readings from Last 3 Encounters:  05/11/21 (!) 141/84  02/03/21 132/82  12/25/20 (!) 145/85    Next A1c due: Today   History reviewed. No pertinent past medical history.  Past Surgical History:  Procedure Laterality Date   APPENDECTOMY     CHOLECYSTECTOMY       History reviewed. No pertinent family history.  Social History   Socioeconomic History   Marital status: Married    Spouse name: Not on file   Number of children: Not on file   Years of education: Not on file   Highest education level: Not on file  Occupational History   Not on file  Tobacco Use   Smoking status: Never   Smokeless tobacco: Never  Substance and Sexual Activity   Alcohol use: Not Currently   Drug use: Not Currently   Sexual activity: Not on file  Other Topics Concern   Not on file  Social History Narrative   Not on file   Social Determinants of Health   Financial Resource Strain: Not on file  Food Insecurity: Not on file  Transportation Needs: Not on file  Physical Activity: Not on file  Stress: Not on file  Social Connections: Not on file  Intimate Partner Violence: Not on file    Outpatient Medications Prior to Visit  Medication Sig Dispense Refill   amLODipine (NORVASC) 5 MG tablet Take 1 tablet (5 mg total) by mouth daily. 90 tablet 0   blood glucose meter kit and supplies Dispense based on patient and insurance preference. Use up to four times daily as directed. (FOR ICD-10 E10.9, E11.9). 1 each 99   Multiple Vitamin (MULTIVITAMIN WITH MINERALS) TABS tablet Take  1 tablet by mouth daily.     ondansetron (ZOFRAN) 8 MG tablet Take 1 tablet (8 mg total) by mouth every 8 (eight) hours as needed for nausea or vomiting. Take 1 tab (28m) every 8 hours as needed for nausea. 30 tablet 2   simvastatin (ZOCOR) 5 MG tablet Take 1 tablet (5 mg total) by mouth at bedtime. 90 tablet 3   spironolactone (ALDACTONE) 25 MG tablet Take 1 tablet (25 mg total) by mouth daily. 90 tablet 0   Semaglutide,0.25 or 0.5MG/DOS, (OZEMPIC, 0.25 OR 0.5 MG/DOSE,) 2 MG/1.5ML SOPN Inject 0.5 mg into the skin once a week. Inject 0.238minto the skin once a week for 4 weeks, THEN increase to inject 0.59m34mnto the skin weekly. 4.5 mL 3   No facility-administered medications prior to  visit.    Allergies  Allergen Reactions   Penicillins Anaphylaxis    ROS Review of Systems All review of systems negative except what is listed in the HPI    Objective:    Physical Exam Vitals and nursing note reviewed.  Constitutional:      Appearance: Normal appearance.  HENT:     Head: Normocephalic.  Eyes:     Extraocular Movements: Extraocular movements intact.     Conjunctiva/sclera: Conjunctivae normal.     Pupils: Pupils are equal, round, and reactive to light.  Neck:     Vascular: No carotid bruit.  Cardiovascular:     Rate and Rhythm: Normal rate and regular rhythm.     Pulses: Normal pulses.     Heart sounds: Normal heart sounds.  Pulmonary:     Effort: Pulmonary effort is normal.     Breath sounds: Normal breath sounds.  Musculoskeletal:     Cervical back: Normal range of motion.     Right lower leg: No edema.     Left lower leg: No edema.  Skin:    General: Skin is warm and dry.     Capillary Refill: Capillary refill takes less than 2 seconds.  Neurological:     General: No focal deficit present.     Mental Status: She is alert and oriented to person, place, and time.  Psychiatric:        Mood and Affect: Mood normal.        Behavior: Behavior normal.        Thought Content: Thought content normal.        Judgment: Judgment normal.    BP (!) 141/84   Pulse 71   Ht 5' 9"  (1.753 m)   Wt 234 lb 6.4 oz (106.3 kg)   SpO2 100%   BMI 34.61 kg/m  Wt Readings from Last 3 Encounters:  05/11/21 234 lb 6.4 oz (106.3 kg)  02/03/21 234 lb (106.1 kg)  12/25/20 236 lb 3.2 oz (107.1 kg)     Health Maintenance Due  Topic Date Due   COVID-19 Vaccine (1) Never done   PNEUMOCOCCAL POLYSACCHARIDE VACCINE AGE 103-64 HIGH RISK  Never done   FOOT EXAM  Never done   OPHTHALMOLOGY EXAM  Never done   HIV Screening  Never done   Hepatitis C Screening  Never done   TETANUS/TDAP  Never done   PAP SMEAR-Modifier  Never done   COLONOSCOPY (Pts [id number]  coverage will need to be confirmed)  Never done    There are no preventive care reminders to display for this patient.  Lab Results  Component Value Date   TSH 0.998 01/27/2021   Lab  Results  Component Value Date   WBC 9.2 01/27/2021   HGB 13.0 01/27/2021   HCT 38.0 01/27/2021   MCV 83.9 01/27/2021   PLT 266 01/27/2021   Lab Results  Component Value Date   NA 138 01/27/2021   K 4.1 01/27/2021   CO2 24 01/27/2021   GLUCOSE 131 (H) 01/27/2021   BUN 13 01/27/2021   CREATININE 0.74 01/27/2021   BILITOT 0.2 (L) 01/27/2021   ALKPHOS 84 01/27/2021   AST 19 01/27/2021   ALT 29 01/27/2021   PROT 6.9 01/27/2021   ALBUMIN 4.2 01/27/2021   CALCIUM 9.0 01/27/2021   ANIONGAP 10 01/27/2021   Lab Results  Component Value Date   CHOL 196 01/27/2021   Lab Results  Component Value Date   HDL 53 01/27/2021   Lab Results  Component Value Date   LDLCALC 72 01/27/2021   Lab Results  Component Value Date   TRIG 355 (H) 01/27/2021   Lab Results  Component Value Date   CHOLHDL 3.7 01/27/2021   Lab Results  Component Value Date   HGBA1C 7.1 (H) 01/27/2021      Assessment & Plan:   Problem List Items Addressed This Visit     Primary hypertension    Blood pressure is slightly elevated today at 141/84. No alarm symptoms present. At this time we will continue medications as prescribed but need to consider adding lisinopril in the future for kidney protection. Follow-up in 3 months.      Type 2 diabetes mellitus with hyperglycemia, without long-term current use of insulin (HCC) - Primary    A1c today. Appears to be doing well on Ozempic without side effects with dose increases. Will increase dose to 1 mg/week after she completes the next 4 weeks of 0.5 mg to avoid wasting medication. Average blood sugars at home in the 130s 2 hours postprandial and when fasting. No alarm symptoms present today. Microalbumin was normal today. Follow-up in 3 months.      Relevant  Medications   Semaglutide, 1 MG/DOSE, 4 MG/3ML SOPN   Other Relevant Orders   POCT UA - Microalbumin (Completed)   Hemoglobin A1c    Meds ordered this encounter  Medications   Semaglutide, 1 MG/DOSE, 4 MG/3ML SOPN    Sig: Inject 1 mg as directed once a week.    Dispense:  12 mL    Refill:  1    Follow-up: Return for 3-4 month DM.    Orma Render, NP

## 2021-05-11 NOTE — Patient Instructions (Signed)
Let me know if you would like to see cardiology to have the EKG evaluated further. I will do some research to see how accurate the watch monitors are.   We will increase the Ozempic to 1mg  after you finish the doses that you have and then we will recheck in about 3-4 month to see where you are.

## 2021-05-11 NOTE — Assessment & Plan Note (Signed)
Blood pressure is slightly elevated today at 141/84. No alarm symptoms present. At this time we will continue medications as prescribed but need to consider adding lisinopril in the future for kidney protection. Follow-up in 3 months.

## 2021-05-11 NOTE — Assessment & Plan Note (Signed)
A1c today. Appears to be doing well on Ozempic without side effects with dose increases. Will increase dose to 1 mg/week after she completes the next 4 weeks of 0.5 mg to avoid wasting medication. Average blood sugars at home in the 130s 2 hours postprandial and when fasting. No alarm symptoms present today. Microalbumin was normal today. Follow-up in 3 months.

## 2021-05-12 LAB — HEMOGLOBIN A1C
Est. average glucose Bld gHb Est-mCnc: 148 mg/dL
Hgb A1c MFr Bld: 6.8 % — ABNORMAL HIGH (ref 4.8–5.6)

## 2021-05-22 ENCOUNTER — Telehealth (HOSPITAL_BASED_OUTPATIENT_CLINIC_OR_DEPARTMENT_OTHER): Payer: Self-pay

## 2021-05-22 NOTE — Telephone Encounter (Signed)
-----   Message from Tollie Eth, NP sent at 05/14/2021  7:01 AM EDT ----- Orpah Melter,   Your A1c is improving!! This is great news. I believe as we increase the Ozempic, we will continue to see improvements! Keep up the great work!  SaraBeth

## 2021-05-22 NOTE — Telephone Encounter (Signed)
Results reviewed by patient via MyChart.  Seen on 05/14/2021  9:12 AM

## 2021-06-27 ENCOUNTER — Other Ambulatory Visit (HOSPITAL_BASED_OUTPATIENT_CLINIC_OR_DEPARTMENT_OTHER): Payer: Self-pay | Admitting: Nurse Practitioner

## 2021-06-27 DIAGNOSIS — R601 Generalized edema: Secondary | ICD-10-CM

## 2021-06-27 DIAGNOSIS — I10 Essential (primary) hypertension: Secondary | ICD-10-CM

## 2021-07-26 ENCOUNTER — Other Ambulatory Visit (HOSPITAL_BASED_OUTPATIENT_CLINIC_OR_DEPARTMENT_OTHER): Payer: Self-pay | Admitting: Nurse Practitioner

## 2021-07-26 DIAGNOSIS — I10 Essential (primary) hypertension: Secondary | ICD-10-CM

## 2021-08-25 ENCOUNTER — Other Ambulatory Visit (HOSPITAL_BASED_OUTPATIENT_CLINIC_OR_DEPARTMENT_OTHER): Payer: Self-pay | Admitting: Nurse Practitioner

## 2021-08-25 DIAGNOSIS — I10 Essential (primary) hypertension: Secondary | ICD-10-CM

## 2021-08-27 DIAGNOSIS — Z5321 Procedure and treatment not carried out due to patient leaving prior to being seen by health care provider: Secondary | ICD-10-CM | POA: Insufficient documentation

## 2021-08-27 DIAGNOSIS — R002 Palpitations: Secondary | ICD-10-CM | POA: Insufficient documentation

## 2021-08-27 DIAGNOSIS — R0600 Dyspnea, unspecified: Secondary | ICD-10-CM | POA: Insufficient documentation

## 2021-08-27 DIAGNOSIS — R110 Nausea: Secondary | ICD-10-CM | POA: Insufficient documentation

## 2021-08-28 ENCOUNTER — Emergency Department (HOSPITAL_BASED_OUTPATIENT_CLINIC_OR_DEPARTMENT_OTHER)
Admission: EM | Admit: 2021-08-28 | Discharge: 2021-08-28 | Disposition: A | Discharge status: Home / Self Care | Payer: 59 | Attending: Emergency Medicine | Admitting: Emergency Medicine

## 2021-08-28 ENCOUNTER — Encounter (HOSPITAL_BASED_OUTPATIENT_CLINIC_OR_DEPARTMENT_OTHER): Payer: Self-pay | Admitting: Nurse Practitioner

## 2021-08-28 NOTE — ED Triage Notes (Signed)
Pt states that she woke up at 2230 with palpations, dyspnea, and nausea. Pt is asymptomatic at this time and denies all symptoms previously stated. Pt states that this has happened to her 3 times in the past year, and has never been evaluated. A&Ox3, VSS, ambulatory without distress.

## 2021-09-01 ENCOUNTER — Ambulatory Visit (HOSPITAL_BASED_OUTPATIENT_CLINIC_OR_DEPARTMENT_OTHER): Payer: Self-pay | Admitting: Family Medicine

## 2021-09-01 ENCOUNTER — Encounter (HOSPITAL_BASED_OUTPATIENT_CLINIC_OR_DEPARTMENT_OTHER): Payer: Self-pay

## 2021-09-08 ENCOUNTER — Other Ambulatory Visit (HOSPITAL_BASED_OUTPATIENT_CLINIC_OR_DEPARTMENT_OTHER): Payer: Self-pay | Admitting: Nurse Practitioner

## 2021-09-08 DIAGNOSIS — I10 Essential (primary) hypertension: Secondary | ICD-10-CM

## 2021-12-03 ENCOUNTER — Encounter (HOSPITAL_COMMUNITY): Payer: Self-pay | Admitting: *Deleted

## 2021-12-03 ENCOUNTER — Emergency Department (HOSPITAL_COMMUNITY): Payer: BC Managed Care – PPO

## 2021-12-03 ENCOUNTER — Other Ambulatory Visit: Payer: Self-pay

## 2021-12-03 ENCOUNTER — Emergency Department (HOSPITAL_COMMUNITY)
Admission: EM | Admit: 2021-12-03 | Discharge: 2021-12-03 | Disposition: A | Discharge status: Home / Self Care | Payer: BC Managed Care – PPO | Attending: Emergency Medicine | Admitting: Emergency Medicine

## 2021-12-03 DIAGNOSIS — R29810 Facial weakness: Secondary | ICD-10-CM | POA: Diagnosis present

## 2021-12-03 DIAGNOSIS — E1165 Type 2 diabetes mellitus with hyperglycemia: Secondary | ICD-10-CM | POA: Insufficient documentation

## 2021-12-03 DIAGNOSIS — I10 Essential (primary) hypertension: Secondary | ICD-10-CM | POA: Insufficient documentation

## 2021-12-03 DIAGNOSIS — Z79899 Other long term (current) drug therapy: Secondary | ICD-10-CM | POA: Insufficient documentation

## 2021-12-03 DIAGNOSIS — Z794 Long term (current) use of insulin: Secondary | ICD-10-CM | POA: Insufficient documentation

## 2021-12-03 DIAGNOSIS — G510 Bell's palsy: Secondary | ICD-10-CM | POA: Insufficient documentation

## 2021-12-03 HISTORY — DX: Essential (primary) hypertension: I10

## 2021-12-03 HISTORY — DX: Type 2 diabetes mellitus without complications: E11.9

## 2021-12-03 LAB — BASIC METABOLIC PANEL
Anion gap: 6 (ref 5–15)
BUN: 13 mg/dL (ref 6–20)
CO2: 26 mmol/L (ref 22–32)
Calcium: 8.9 mg/dL (ref 8.9–10.3)
Chloride: 108 mmol/L (ref 98–111)
Creatinine, Ser: 0.73 mg/dL (ref 0.44–1.00)
GFR, Estimated: >60 mL/min (ref >60)
Glucose, Bld: 124 mg/dL — ABNORMAL HIGH (ref 70–99)
Potassium: 3.5 mmol/L (ref 3.5–5.1)
Sodium: 140 mmol/L (ref 135–145)

## 2021-12-03 LAB — CBC WITH DIFFERENTIAL/PLATELET
Abs Immature Granulocytes: 0.04 10*3/uL (ref 0.00–0.07)
Basophils Absolute: 0 10*3/uL (ref 0.0–0.1)
Basophils Relative: 1 %
Eosinophils Absolute: 0.2 10*3/uL (ref 0.0–0.5)
Eosinophils Relative: 3 %
HCT: 39.9 % (ref 36.0–46.0)
Hemoglobin: 13.2 g/dL (ref 12.0–15.0)
Immature Granulocytes: 1 %
Lymphocytes Relative: 31 %
Lymphs Abs: 2.7 10*3/uL (ref 0.7–4.0)
MCH: 27.7 pg (ref 26.0–34.0)
MCHC: 33.1 g/dL (ref 30.0–36.0)
MCV: 83.6 fL (ref 80.0–100.0)
Monocytes Absolute: 0.5 10*3/uL (ref 0.1–1.0)
Monocytes Relative: 6 %
Neutro Abs: 5 10*3/uL (ref 1.7–7.7)
Neutrophils Relative %: 58 %
Platelets: 292 10*3/uL (ref 150–400)
RBC: 4.77 MIL/uL (ref 3.87–5.11)
RDW: 13.5 % (ref 11.5–15.5)
WBC: 8.5 10*3/uL (ref 4.0–10.5)
nRBC: 0 % (ref 0.0–0.2)

## 2021-12-03 MED ORDER — VALACYCLOVIR HCL 1 G PO TABS
1000.0000 mg | ORAL_TABLET | Freq: Three times a day (TID) | ORAL | 0 refills | Status: DC
Start: 2021-12-03 — End: 2022-12-13

## 2021-12-03 MED ORDER — PREDNISONE 20 MG PO TABS: 40.0000 mg | ORAL_TABLET | Freq: Every day | ORAL | 0 refills | Status: AC

## 2021-12-03 NOTE — Discharge Instructions (Addendum)
Please return to the ED with any new or worsening symptoms such as continued facial droop, slurred speech, weakness ?Please follow-up with your PCP in the next week for reevaluation ?Please pick up medications have sent in for you. ?Please read the attached informational guide concerning Bell's palsy ?While taking steroids, please make sure that you are monitoring her blood sugar at home.  Please make smart dietary choices.  Steroids cause your blood sugar to increase. ?

## 2021-12-03 NOTE — ED Triage Notes (Signed)
States she noted a slight facial droop with some eye drooping 2 days ago while doing online interview ?

## 2021-12-03 NOTE — ED Provider Notes (Signed)
?[facility] ?Provider Note ? ? ?CSN: [id number] ?Arrival date & time: 12/03/21  1528 ? ?  ? ?History ? ?Chief Complaint  ?Patient presents with  ? Facial Droop  ? ? ?Karen Hebert is a 51 y.o. female with medical history significant for diabetes and hypertension.  Patient reports ED for evaluation of facial droop.  Patient states that beginning 2 days ago she noted left-sided facial droop while doing online interview.  Patient reports that she then FaceTime her daughter who also stated that she believed that the left side of her face was drooping.  Patient reports that she felt as if she was not able to fully smile, was not able to fully close her left eye.  Patient has been is at bedside and also confirms that patient had slight left-sided facial droop.  Patient is endorsing facial droop.  Patient denies any weakness, speech difficulty, numbness, lightheadedness, dizziness, headache, neck pain, nausea, vomiting, recent fevers, rash, pain. ? ?HPI ? ?  ? ?Home Medications ?Prior to Admission medications   ?Medication Sig Start Date End Date Taking? Authorizing Provider  ?predniSONE (DELTASONE) 20 MG tablet Take 2 tablets (40 mg total) by mouth daily for 4 days. 12/03/21 12/07/21 Yes Azucena Cecil, PA-C  ?valACYclovir (VALTREX) 1000 MG tablet Take 1 tablet (1,000 mg total) by mouth 3 (three) times daily. 12/03/21  Yes Azucena Cecil, PA-C  ?amLODipine (NORVASC) 5 MG tablet Take 1 tablet (5 mg total) by mouth daily. Please schedule F/U Appt for future refills. Labs Required. 09/17/21   Orma Render, NP  ?blood glucose meter kit and supplies Dispense based on patient and insurance preference. Use up to four times daily as directed. (FOR ICD-10 E10.9, E11.9). 02/03/21   Orma Render, NP  ?Multiple Vitamin (MULTIVITAMIN WITH MINERALS) TABS tablet Take 1 tablet by mouth daily.    [provider]  ?ondansetron (ZOFRAN) 8 MG tablet Take 1 tablet (8 mg total) by mouth every 8 (eight)  hours as needed for nausea or vomiting. Take 1 tab (2m) every 8 hours as needed for nausea. 02/03/21   EOrma Render NP  ?Semaglutide, 1 MG/DOSE, 4 MG/3ML SOPN Inject 1 mg as directed once a week. 05/11/21   EOrma Render NP  ?simvastatin (ZOCOR) 5 MG tablet Take 1 tablet (5 mg total) by mouth at bedtime. 02/03/21   EOrma Render NP  ?spironolactone (ALDACTONE) 25 MG tablet TAKE ONE TABLET BY MOUTH DAILY 06/30/21   Early, SCoralee Pesa NP  ?   ? ?Allergies    ?Penicillins   ? ?Review of Systems   ?Review of Systems  ?Constitutional:  Negative for fever.  ?Gastrointestinal:  Negative for nausea and vomiting.  ?Musculoskeletal:  Negative for neck pain.  ?Skin:  Negative for rash.  ?Neurological:  Positive for facial asymmetry. Negative for dizziness, speech difficulty, weakness, light-headedness, numbness and headaches.  ? ?Physical Exam ?Updated Vital Signs ?BP (!) 147/102   Pulse 63   Temp 98 ?F (36.7 ?C) (Oral)   Resp 16   Ht 5' 9"  (1.753 m)   Wt 96.2 kg   SpO2 97%   BMI 31.31 kg/m?  ?Physical Exam ?Vitals and nursing note reviewed.  ?Constitutional:   ?   General: She is not in acute distress. ?   Appearance: Normal appearance. She is not ill-appearing, toxic-appearing or diaphoretic.  ?HENT:  ?   Head: Normocephalic and atraumatic.  ?   Right Ear: Tympanic membrane normal.  ?  Left Ear: Tympanic membrane normal.  ?   Nose: Nose normal. No congestion.  ?   Mouth/Throat:  ?   Mouth: Mucous membranes are moist.  ?   Pharynx: Oropharynx is clear.  ?Eyes:  ?   Extraocular Movements: Extraocular movements intact.  ?   Conjunctiva/sclera: Conjunctivae normal.  ?   Pupils: Pupils are equal, round, and reactive to light.  ?Cardiovascular:  ?   Rate and Rhythm: Normal rate and regular rhythm.  ?Pulmonary:  ?   Effort: Pulmonary effort is normal.  ?   Breath sounds: Normal breath sounds.  ?Abdominal:  ?   General: Abdomen is flat. Bowel sounds are normal.  ?   Palpations: Abdomen is soft.  ?   Tenderness: There is no  abdominal tenderness.  ?Musculoskeletal:  ?   Cervical back: Normal range of motion and neck supple. No tenderness.  ?Skin: ?   General: Skin is warm and dry.  ?   Capillary Refill: Capillary refill takes less than 2 seconds.  ?Neurological:  ?   General: No focal deficit present.  ?   Mental Status: She is alert and oriented to person, place, and time.  ?   GCS: GCS eye subscore is 4. GCS verbal subscore is 5. GCS motor subscore is 6.  ?   Cranial Nerves: Cranial nerves 2-12 are intact. No cranial nerve deficit.  ?   Sensory: Sensation is intact. No sensory deficit.  ?   Motor: Motor function is intact. No weakness.  ?   Coordination: Coordination is intact. Heel to [facility] Test normal.  ? ? ?ED Results / Procedures / Treatments   ?Labs ?(all labs ordered are listed, but only abnormal results are displayed) ?Labs Reviewed  ?BASIC METABOLIC PANEL - Abnormal; Notable for the following components:  ?    Result Value  ? Glucose, Bld 124 (*)   ? All other components within normal limits  ?CBC WITH DIFFERENTIAL/PLATELET  ? ? ?EKG ?None ? ?Radiology ?CT Head Wo Contrast ? ?Result Date: 12/03/2021 ?CLINICAL DATA:  Provided history: Neuro deficit, acute, stroke suspected. Additional history provided: Patient reports noting a slight facial droop 2 days ago. History of diabetes and hypertension. EXAM: CT HEAD WITHOUT CONTRAST TECHNIQUE: Contiguous axial images were obtained from the base of the skull through the vertex without intravenous contrast. RADIATION DOSE REDUCTION: This exam was performed according to the departmental dose-optimization program which includes automated exposure control, adjustment of the mA and/or kV according to patient size and/or use of iterative reconstruction technique. COMPARISON:  Prior head CT 12/02/2020. FINDINGS: Brain: Cerebral volume is normal. There is no acute intracranial hemorrhage. No demarcated cortical infarct. No extra-axial fluid collection. No evidence of an intracranial mass. No  midline shift. Vascular: No hyperdense vessel. Skull: Normal. Negative for fracture or focal lesion. Sinuses/Orbits: Visualized orbits show no acute finding. Mild mucosal thickening within the left frontal and ethmoid sinuses. IMPRESSION: No evidence of acute intracranial abnormality. Mild mucosal thickening within the left frontal and ethmoid sinuses. Electronically Signed   By: Kellie Simmering D.O.   On: 12/03/2021 17:55   ? ?Procedures ?Procedures  ? ? ?Medications Ordered in ED ?Medications - No data to display ? ?ED Course/ Medical Decision Making/ A&P ?  ?                        ?Medical Decision Making ?Amount and/or Complexity of Data Reviewed ?Labs: ordered. ?Radiology: ordered. ? ? ?51 year old female presents ED  for evaluation of facial droop.  Please see HPI for further details. ? ?On examination, the patient is afebrile, nontachycardic, not hypoxic.  Patient lung sounds are clear bilaterally.  Patient abdomen soft compressible all 4 quadrants.  Patient neurological examination does not show any focal neurodeficits.  I do not appreciate any facial droop.  The patient's husband however states that he does feel that the patient has facial droop.  The patient is able to fully close her left eye.  The patient denies any left-sided ear pain. ? ?Patient worked up utilizing following labs imaging studies interpreted by me personally: ?- CBC unremarkable ?- BMP shows slightly elevated glucose, patient reports she is compliant on diabetic medication ?- CT head does not show any signs of intracranial abnormality, midline shift and herniation ? ?At this time, most likely cause of patient suspected facial droop due to Bell's palsy.  Patient will be placed on steroids and acyclovir therapy and advised to follow-up with her PCP.  Patient advised to also purchase artificial tears and if she is unable to close her left eye fully, to tape her eye shut at night and to purchase goggles for protection.  Patient voiced  understanding with these instructions.  Patient provided with return precautions and she is voiced understanding.  Patient had all her questions answered to her satisfaction.  The patient stable this time for discharge home. ?

## 2021-12-24 ENCOUNTER — Telehealth (HOSPITAL_BASED_OUTPATIENT_CLINIC_OR_DEPARTMENT_OTHER): Payer: Self-pay

## 2021-12-24 NOTE — Telephone Encounter (Signed)
Received prior auth through cover my meds for Ozempic not sent to plan as patient is self pay and no insurance. ?

## 2021-12-29 ENCOUNTER — Other Ambulatory Visit (HOSPITAL_BASED_OUTPATIENT_CLINIC_OR_DEPARTMENT_OTHER): Payer: Self-pay | Admitting: Nurse Practitioner

## 2021-12-29 DIAGNOSIS — I10 Essential (primary) hypertension: Secondary | ICD-10-CM

## 2021-12-29 DIAGNOSIS — R601 Generalized edema: Secondary | ICD-10-CM

## 2022-05-17 ENCOUNTER — Encounter (HOSPITAL_BASED_OUTPATIENT_CLINIC_OR_DEPARTMENT_OTHER): Payer: Self-pay

## 2022-05-17 DIAGNOSIS — I10 Essential (primary) hypertension: Secondary | ICD-10-CM

## 2022-05-17 DIAGNOSIS — Z6834 Body mass index (BMI) 34.0-34.9, adult: Secondary | ICD-10-CM

## 2022-05-17 DIAGNOSIS — E1165 Type 2 diabetes mellitus with hyperglycemia: Secondary | ICD-10-CM

## 2022-05-17 DIAGNOSIS — E1169 Type 2 diabetes mellitus with other specified complication: Secondary | ICD-10-CM

## 2022-05-18 ENCOUNTER — Ambulatory Visit (HOSPITAL_BASED_OUTPATIENT_CLINIC_OR_DEPARTMENT_OTHER): Payer: Self-pay | Admitting: Nurse Practitioner

## 2022-05-24 ENCOUNTER — Other Ambulatory Visit (HOSPITAL_BASED_OUTPATIENT_CLINIC_OR_DEPARTMENT_OTHER): Payer: Self-pay

## 2022-05-24 DIAGNOSIS — E1165 Type 2 diabetes mellitus with hyperglycemia: Secondary | ICD-10-CM

## 2022-05-24 MED ORDER — SEMAGLUTIDE (1 MG/DOSE) 4 MG/3ML ~~LOC~~ SOPN: 1.0000 mg | PEN_INJECTOR | SUBCUTANEOUS | 1 refills | Status: DC

## 2022-06-03 ENCOUNTER — Other Ambulatory Visit (HOSPITAL_BASED_OUTPATIENT_CLINIC_OR_DEPARTMENT_OTHER): Payer: Self-pay

## 2022-06-03 ENCOUNTER — Other Ambulatory Visit (HOSPITAL_COMMUNITY): Payer: Self-pay

## 2022-06-03 MED ORDER — SEMAGLUTIDE (1 MG/DOSE) 4 MG/3ML ~~LOC~~ SOPN
1.0000 mg | PEN_INJECTOR | SUBCUTANEOUS | 2 refills | Status: DC
  Filled 2022-06-03: qty 3, 28d supply, fill #0

## 2022-06-04 ENCOUNTER — Ambulatory Visit (INDEPENDENT_AMBULATORY_CARE_PROVIDER_SITE_OTHER): Payer: Self-pay | Admitting: Nurse Practitioner

## 2022-06-16 ENCOUNTER — Other Ambulatory Visit (HOSPITAL_BASED_OUTPATIENT_CLINIC_OR_DEPARTMENT_OTHER): Payer: Self-pay | Admitting: Nurse Practitioner

## 2022-06-16 DIAGNOSIS — I10 Essential (primary) hypertension: Secondary | ICD-10-CM

## 2022-06-16 DIAGNOSIS — Z6834 Body mass index (BMI) 34.0-34.9, adult: Secondary | ICD-10-CM

## 2022-06-16 DIAGNOSIS — E782 Mixed hyperlipidemia: Secondary | ICD-10-CM

## 2022-06-16 DIAGNOSIS — E1165 Type 2 diabetes mellitus with hyperglycemia: Secondary | ICD-10-CM

## 2022-06-16 MED ORDER — SEMAGLUTIDE (1 MG/DOSE) 4 MG/3ML ~~LOC~~ SOPN: 1.0000 mg | PEN_INJECTOR | SUBCUTANEOUS | 1 refills | Status: DC

## 2022-06-29 ENCOUNTER — Ambulatory Visit (HOSPITAL_BASED_OUTPATIENT_CLINIC_OR_DEPARTMENT_OTHER): Payer: Managed Care, Other (non HMO) | Admitting: Nurse Practitioner

## 2022-06-29 NOTE — Progress Notes (Signed)
Patient not seen.

## 2022-08-18 ENCOUNTER — Emergency Department (HOSPITAL_COMMUNITY)
Admission: EM | Admit: 2022-08-18 | Discharge: 2022-08-18 | Disposition: A | Discharge status: Home / Self Care | Payer: Managed Care, Other (non HMO) | Attending: Emergency Medicine | Admitting: Emergency Medicine

## 2022-08-18 ENCOUNTER — Encounter (HOSPITAL_COMMUNITY): Payer: Self-pay | Admitting: *Deleted

## 2022-08-18 ENCOUNTER — Other Ambulatory Visit: Payer: Self-pay

## 2022-08-18 ENCOUNTER — Emergency Department (HOSPITAL_COMMUNITY): Payer: Managed Care, Other (non HMO)

## 2022-08-18 DIAGNOSIS — R059 Cough, unspecified: Secondary | ICD-10-CM | POA: Diagnosis present

## 2022-08-18 DIAGNOSIS — J069 Acute upper respiratory infection, unspecified: Secondary | ICD-10-CM | POA: Insufficient documentation

## 2022-08-18 NOTE — Discharge Instructions (Addendum)
You were seen today for fever, cough, congestion. We did not identify any emergent cause for your symptoms. Your evaluation is most consistent with viral upper respiratory infection.  Current guidelines from the [employer] of Infectious Disease are to not treat sinus infections with antibiotics until 10 days.  I do not recommend taking doxycycline prescribed by urgent care based on her current clinical course. This may need to be reassessed after 10 days of concurrent symptoms. You should continue with supportive care techniques as below.  Plan and next steps:   The following may be helpful in managing your symptoms:   Pain- Lidocaine Patches  Apply to affected area for up to 12 hours at a time.   Pain/Fever- Adult Tylenol dosing:  650 mg orally every 4 to 6 hours as needed, MAX: 3250 mg/24 hours   (Extra-strength) 1000 mg orally every 6 hours as needed; MAX: 3000 mg/24 hours   Do not use if you have liver disease. Read the label on the bottle.   Pain/Fever- Adult Ibuprofen Dosing  200 to 400 mg orally every 4 to 6 hours as needed; MAX 1200 mg/day; do not take longer than 10 days   Do not use if you have kidney disease. Read the label on the bottle     Findings:  You may see all of your lab and imaging results utilizing our online portal! Look in this document or ask a team member for your mychart* access information. The most notable results have additionally been verbally communicated with you and your bedside family.    Follow-up Plan:   Follow up with the patient's normal primary care provider for monitoring of this condition within 48 hours.   Signs/Symptoms that would warrant return to the ED:  Please return to the ED if you experience worsening of symptoms or any abrupt changes in your health. Standard of care precautions for your chief complaint have already been verbally communicated with you. Always be on alert for fevers, chills, shortness of breath, chest pains, or  sudden changes that warrant immediate evaluation.    Thank you for allowing [place] to be a part of you and your families' care.   Glyn Ade MD

## 2022-08-18 NOTE — ED Triage Notes (Signed)
Pt c/o nasal congestion and cough with body aches, headache and chest discomfort;  Pt states she started on Saturday with fever of 103  Pt states she went to urgent care and was tested for covid, flu and rsv and all were negative  Pt went to urgent care yesterday and was prescribed doxycycline and she states when she takes it she vomits it everytime

## 2022-08-18 NOTE — ED Provider Notes (Signed)
[facility] EMERGENCY DEPARTMENT Provider Note   CSN: [id number] Arrival date & time: 08/18/22  7616     History Chief Complaint  Patient presents with   Cough    HPI Karen Hebert is a 51 y.o. female presenting for fever cough and congestion over the past 4 days. No known sick contacts. Works around other people and is worried about spreading it. Has been tested for COVID and flu multiple times negative.  Denies sore throat.  Otherwise ambulatory tolerating p.o. intake   Patient's recorded medical, surgical, social, medication list and allergies were reviewed in the Snapshot window as part of the initial history.   Review of Systems   Review of Systems  Constitutional:  Positive for fever. Negative for chills.  HENT:  Positive for congestion. Negative for ear pain and sore throat.   Eyes:  Negative for pain and visual disturbance.  Respiratory:  Positive for cough. Negative for shortness of breath.   Cardiovascular:  Negative for chest pain and palpitations.  Gastrointestinal:  Negative for abdominal pain and vomiting.  Genitourinary:  Negative for dysuria and hematuria.  Musculoskeletal:  Negative for arthralgias and back pain.  Skin:  Negative for color change and rash.  Neurological:  Negative for seizures and syncope.  All other systems reviewed and are negative.   Physical Exam Updated Vital Signs BP (!) 119/95 (BP Location: Left Arm)   Pulse 78   Temp 97.6 F (36.4 C) (Oral)   Resp 16   Ht 5\' 9"  (1.753 m)   Wt 96.2 kg   SpO2 99%   BMI 31.31 kg/m  Physical Exam Vitals and nursing note reviewed.  Constitutional:      General: She is not in acute distress.    Appearance: She is well-developed.  HENT:     Head: Normocephalic and atraumatic.  Eyes:     Conjunctiva/sclera: Conjunctivae normal.  Cardiovascular:     Rate and Rhythm: Normal rate and regular rhythm.     Heart sounds: No murmur heard. Pulmonary:     Effort: Pulmonary effort is normal.  No respiratory distress.     Breath sounds: Normal breath sounds.  Abdominal:     General: There is no distension.     Palpations: Abdomen is soft.     Tenderness: There is no abdominal tenderness. There is no right CVA tenderness or left CVA tenderness.  Musculoskeletal:        General: No swelling or tenderness. Normal range of motion.     Cervical back: Neck supple.  Skin:    General: Skin is warm and dry.  Neurological:     General: No focal deficit present.     Mental Status: She is alert and oriented to person, place, and time. Mental status is at baseline.     Cranial Nerves: No cranial nerve deficit.      ED Course/ Medical Decision Making/ A&P    Procedures Procedures   Medications Ordered in ED Medications - No data to display Medical Decision Making:   Karen Hebert is a 51 y.o. female who presented to the ED today with subjective fever, cough, congestion detailed above.    Patient's presentation is complicated by their history of DM.  Patient placed on continuous vitals and telemetry monitoring while in ED which was reviewed periodically.   Complete initial physical exam performed, notably the patient  was hemodynamically stable in no acute distress.  Posterior oropharynx illuminated and without obvious swelling or deformity.  Patient  is without neck stiffness.    Reviewed and confirmed nursing documentation for past medical history, family history, social history.    Initial Assessment:   With the patient's presentation of fever cough congestion, most likely diagnosis is developing viral upper respiratory infection. Other diagnoses were considered including (but not limited to) peritonsillar abscess, retropharyngeal abscess, pneumonia. These are considered less likely due to history of present illness and physical exam findings.   This is most consistent with an acute complicated illness Considered meningitis, however patient's symptoms, vital signs, physical  exam findings including lack of meningismus seem grossly less consistent at this time. Initial Plan:  CXR to evaluate for structural/infectious intrathoracic pathology.  Empiric treatment with antipyretics including acetaminophen in ambulatory setting Objective evaluation as below reviewed   Initial Study Results:   Laboratory  All laboratory results reviewed without evidence of clinically relevant pathology.   Radiology:  All images reviewed independently. Agree with radiology report at this time.   DG Chest Portable 1 View  Final Result         Final Assessment and Plan:   On reassessment, patient is ambulatory tolerating p.o. intake in no acute distress.    Patient is currently stable for outpatient care and management with no indication for hospitalization or transfer at this time.  Discussed all findings with patient expressed understanding.  Disposition:  Based on the above findings, I believe patient is stable for discharge.    Patient/family educated about specific return precautions for given chief complaint and symptoms.  Patient/family educated about follow-up with PCP.     Patient/family expressed understanding of return precautions and need for follow-up. Patient spoken to regarding all imaging and laboratory results and appropriate follow up for these results. All education provided in verbal form with additional information in written form. Time was allowed for answering of patient questions. Patient discharged.    Emergency Department Medication Summary:   Medications - No data to display    Clinical Impression:  1. Viral URI with cough         Discharge   Final Clinical Impression(s) / ED Diagnoses Final diagnoses:  Viral URI with cough    Rx / DC Orders ED Discharge Orders     None         Glyn Ade, MD 08/18/22 [phone]

## 2022-12-13 ENCOUNTER — Telehealth: Payer: Self-pay | Admitting: Nurse Practitioner

## 2022-12-13 DIAGNOSIS — E1165 Type 2 diabetes mellitus with hyperglycemia: Secondary | ICD-10-CM

## 2022-12-13 DIAGNOSIS — R601 Generalized edema: Secondary | ICD-10-CM

## 2022-12-13 DIAGNOSIS — E1169 Type 2 diabetes mellitus with other specified complication: Secondary | ICD-10-CM

## 2022-12-13 DIAGNOSIS — Z6834 Body mass index (BMI) 34.0-34.9, adult: Secondary | ICD-10-CM

## 2022-12-13 DIAGNOSIS — I10 Essential (primary) hypertension: Secondary | ICD-10-CM

## 2022-12-13 NOTE — Telephone Encounter (Signed)
Orders for labs placed 

## 2022-12-13 NOTE — Telephone Encounter (Signed)
Pt called & states she has virtual visit tomorrow and is supposed to come in the morning for labs, can you put in orders

## 2022-12-14 ENCOUNTER — Telehealth (INDEPENDENT_AMBULATORY_CARE_PROVIDER_SITE_OTHER): Payer: Managed Care, Other (non HMO) | Admitting: Nurse Practitioner

## 2022-12-14 ENCOUNTER — Encounter: Payer: Self-pay | Admitting: Nurse Practitioner

## 2022-12-14 VITALS — Wt 221.0 lb

## 2022-12-14 DIAGNOSIS — I10 Essential (primary) hypertension: Secondary | ICD-10-CM | POA: Diagnosis not present

## 2022-12-14 DIAGNOSIS — E782 Mixed hyperlipidemia: Secondary | ICD-10-CM

## 2022-12-14 DIAGNOSIS — E1165 Type 2 diabetes mellitus with hyperglycemia: Secondary | ICD-10-CM

## 2022-12-14 DIAGNOSIS — E1169 Type 2 diabetes mellitus with other specified complication: Secondary | ICD-10-CM

## 2022-12-14 DIAGNOSIS — Z6834 Body mass index (BMI) 34.0-34.9, adult: Secondary | ICD-10-CM | POA: Diagnosis not present

## 2022-12-14 DIAGNOSIS — E559 Vitamin D deficiency, unspecified: Secondary | ICD-10-CM

## 2022-12-14 DIAGNOSIS — R601 Generalized edema: Secondary | ICD-10-CM

## 2022-12-14 LAB — LIPID PANEL

## 2022-12-14 MED ORDER — SEMAGLUTIDE (2 MG/DOSE) 8 MG/3ML ~~LOC~~ SOPN: 2.0000 mg | PEN_INJECTOR | SUBCUTANEOUS | 3 refills | Status: DC

## 2022-12-14 MED ORDER — ONDANSETRON HCL 8 MG PO TABS: 8.0000 mg | ORAL_TABLET | Freq: Three times a day (TID) | ORAL | 2 refills | Status: DC | PRN

## 2022-12-14 NOTE — Progress Notes (Signed)
Virtual Visit Encounter mychart visit.   I connected with  Karen Hebert on 12/21/22 at  4:15 PM EDT by secure video and audio telemedicine application. I verified that I am speaking with the correct person using two identifiers.   I introduced myself as a Publishing rights manager with the practice. The limitations of evaluation and management by telemedicine discussed with the patient and the availability of in person appointments. The patient expressed verbal understanding and consent to proceed.  Participating parties in this visit include: Myself and patient  The patient is: Patient Location: Other:  work I am: Provider Location: Office/Clinic Subjective:    CC and HPI: Karen Hebert is a 52 y.o. year old female presenting for follow up of DM, HTN, HLD. Patient reports the following:  [name] presents today to discuss medication refills and potential changes. Her current medications include Ozempic, amlodipine, simvastatin, and spironolactone.  The patient reports not having been on amlodipine, simvastatin, and spironolactone for an extended period and is awaiting lab results to determine if they should be restarted. Her blood pressure has been stable, and she believes she may not need to continue amlodipine. She tolerated simvastatin well in the past and is open to restarting it if her cholesterol levels are elevated.  Regarding spironolactone, the patient is unsure of the reason for taking it and is open to trying a different medication if it would be more effective without causing excessive urination.   She reports tolerating Ozempic well and is currently on a  dose. However, the patient has experienced some cravings and is considering increasing the dose of Ozempic to  in hopes of achieving further weight loss. She has also used Zofran for nausea in the past but does not currently need a refill.  The patient is interested in potentially switching to [facility], an alternative  medication that may help with insulin receptivity, depending on her lab results and insurance coverage.  Past medical history, Surgical history, Family history not pertinant except as noted below, Social history, Allergies, and medications have been entered into the medical record, reviewed, and corrections made.   Review of Systems:  All review of systems negative except what is listed in the HPI  Objective:    Alert and oriented x 4 Speaking in clear sentences with no shortness of breath. No distress.  Impression and Recommendations:    Problem List Items Addressed This Visit     Primary hypertension    The patient's blood pressure is reportedly stable without amlodipine. Plan: - Hold off on restarting amlodipine at this time. - Monitor blood pressure and report any changes.      Relevant Medications   Semaglutide, 2 MG/DOSE, 8 MG/3ML SOPN   Fluid retention in tissues    The patient was previously on spironolactone. Awaiting lab results to assess kidney function before deciding on an appropriate diuretic. Plan: - Send prescription to [facility] pharmacy if indicated based on lab results.      Relevant Medications   Semaglutide, 2 MG/DOSE, 8 MG/3ML SOPN   Body mass index (BMI) of 34.0-34.9 in adult    The patient is currently on Ozempic 1 mg with good tolerance, but cravings have returned. Plan: - Increase Ozempic dose to 2 mg. - Send in a 19-month supply. - Consider switching to [facility] if labs indicate insulin resistance.      Relevant Medications   Semaglutide, 2 MG/DOSE, 8 MG/3ML SOPN   Type 2 diabetes mellitus with hyperglycemia, without long-term current use of  insulin    Currently managed with Ozempic . She would like to increase the dose in order to get better control on her cravings, which is reasonable. We will monitor labs.  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 Awaiting lab results to determine cholesterol levels. Plan: - If cholesterol levels are elevated, restart simvastatin. - Send prescription to [facility] if indicated.      Relevant Medications   Semaglutide, 2 MG/DOSE, 8 MG/3ML SOPN    orders and follow up as documented in EMR I discussed the assessment and treatment plan with the patient. The patient was provided an opportunity to ask questions and all were answered. The patient agreed with the plan and demonstrated an understanding of the instructions.   The patient was advised to call back or seek an in-person evaluation if the symptoms worsen or if the condition fails to improve as anticipated.  Follow-Up: in 3 months  I provided 26 minutes of non-face-to-face interaction with this non face-to-face encounter including intake, same-day documentation, and chart review.   Tollie Eth, NP , DNP, AGNP-c [facility] Medicine

## 2022-12-15 ENCOUNTER — Encounter: Payer: Self-pay | Admitting: Nurse Practitioner

## 2022-12-15 LAB — CBC WITH DIFFERENTIAL/PLATELET
Basophils Absolute: 0 10*3/uL (ref 0.0–0.2)
Basos: 1 %
EOS (ABSOLUTE): 0.2 10*3/uL (ref 0.0–0.4)
Eos: 2 %
Hematocrit: 41.1 % (ref 34.0–46.6)
Hemoglobin: 13.4 g/dL (ref 11.1–15.9)
Immature Grans (Abs): 0 10*3/uL (ref 0.0–0.1)
Immature Granulocytes: 0 %
Lymphocytes Absolute: 2.5 10*3/uL (ref 0.7–3.1)
Lymphs: 38 %
MCH: 27.5 pg (ref 26.6–33.0)
MCHC: 32.6 g/dL (ref 31.5–35.7)
MCV: 84 fL (ref 79–97)
Monocytes Absolute: 0.4 10*3/uL (ref 0.1–0.9)
Monocytes: 6 %
Neutrophils Absolute: 3.5 10*3/uL (ref 1.4–7.0)
Neutrophils: 53 %
Platelets: 307 10*3/uL (ref 150–450)
RBC: 4.88 x10E6/uL (ref 3.77–5.28)
RDW: 13.9 % (ref 11.7–15.4)
WBC: 6.5 10*3/uL (ref 3.4–10.8)

## 2022-12-15 LAB — COMPREHENSIVE METABOLIC PANEL
ALT: 38 IU/L — ABNORMAL HIGH (ref 0–32)
AST: 23 IU/L (ref 0–40)
Albumin/Globulin Ratio: 1.8 (ref 1.2–2.2)
Albumin: 4.6 g/dL (ref 3.8–4.9)
Alkaline Phosphatase: 95 IU/L (ref 44–121)
BUN/Creatinine Ratio: 19 (ref 9–23)
BUN: 15 mg/dL (ref 6–24)
Bilirubin Total: <0.2 mg/dL (ref 0.0–1.2)
CO2: 23 mmol/L (ref 20–29)
Calcium: 9.8 mg/dL (ref 8.7–10.2)
Chloride: 105 mmol/L (ref 96–106)
Creatinine, Ser: 0.8 mg/dL (ref 0.57–1.00)
Globulin, Total: 2.5 g/dL (ref 1.5–4.5)
Glucose: 115 mg/dL — ABNORMAL HIGH (ref 70–99)
Potassium: 4.2 mmol/L (ref 3.5–5.2)
Sodium: 142 mmol/L (ref 134–144)
Total Protein: 7.1 g/dL (ref 6.0–8.5)
eGFR: 89 mL/min/{1.73_m2} (ref >59)

## 2022-12-15 LAB — VITAMIN D 25 HYDROXY (VIT D DEFICIENCY, FRACTURES): Vit D, 25-Hydroxy: 21.5 ng/mL — ABNORMAL LOW (ref 30.0–100.0)

## 2022-12-15 LAB — LIPID PANEL
Chol/HDL Ratio: 3.3 ratio (ref 0.0–4.4)
Cholesterol, Total: 196 mg/dL (ref 100–199)
HDL: 60 mg/dL (ref >39)
LDL Chol Calc (NIH): 108 mg/dL — ABNORMAL HIGH (ref 0–99)
Triglycerides: 163 mg/dL — ABNORMAL HIGH (ref 0–149)
VLDL Cholesterol Cal: 28 mg/dL (ref 5–40)

## 2022-12-15 LAB — HEMOGLOBIN A1C
Est. average glucose Bld gHb Est-mCnc: 140 mg/dL
Hgb A1c MFr Bld: 6.5 % — ABNORMAL HIGH (ref 4.8–5.6)

## 2022-12-21 NOTE — Assessment & Plan Note (Signed)
The patient has not been taking simvastatin recently. Awaiting lab results to determine cholesterol levels. Plan: - If cholesterol levels are elevated, restart simvastatin. - Send prescription to [facility] if indicated.

## 2022-12-21 NOTE — Assessment & Plan Note (Signed)
The patient was previously on spironolactone. Awaiting lab results to assess kidney function before deciding on an appropriate diuretic. Plan: - Send prescription to [facility] pharmacy if indicated based on lab results.

## 2022-12-21 NOTE — Assessment & Plan Note (Signed)
The patient's blood pressure is reportedly stable without amlodipine. Plan: - Hold off on restarting amlodipine at this time. - Monitor blood pressure and report any changes.

## 2022-12-21 NOTE — Assessment & Plan Note (Signed)
Currently managed with Ozempic . She would like to increase the dose in order to get better control on her cravings, which is reasonable. We will monitor labs.  Plan: - Continue Ozempic,  has been sent - If Greggory Keen is covered, we can consider a switch to that

## 2022-12-21 NOTE — Assessment & Plan Note (Signed)
The patient is currently on Ozempic 1 mg with good tolerance, but cravings have returned. Plan: - Increase Ozempic dose to 2 mg. - Send in a 11-month supply. - Consider switching to [facility] if labs indicate insulin resistance.

## 2022-12-22 MED ORDER — VITAMIN D3 1.25 MG (50000 UT) PO TABS: 1.0000 | ORAL_TABLET | ORAL | 1 refills | Status: DC

## 2022-12-22 NOTE — Addendum Note (Signed)
Addended by: Kellyn Mansfield, [place] Dec E on: 12/22/2022 07:09 PM   Modules accepted: Orders

## 2022-12-31 ENCOUNTER — Telehealth: Payer: Self-pay | Admitting: Nurse Practitioner

## 2022-12-31 NOTE — Telephone Encounter (Signed)
See telephone call regarding existing P.A.

## 2022-12-31 NOTE — Telephone Encounter (Signed)
I had not received a rejection but went ahead & completed a P.A. for Ozempic & response states P.A. already on file & approved til 06/25/23.  Adam will you call pharmacy & see what the issue is because no P.A. is needed & let pt know

## 2023-01-06 ENCOUNTER — Telehealth (INDEPENDENT_AMBULATORY_CARE_PROVIDER_SITE_OTHER): Payer: Managed Care, Other (non HMO) | Admitting: Nurse Practitioner

## 2023-01-06 DIAGNOSIS — R109 Unspecified abdominal pain: Secondary | ICD-10-CM

## 2023-01-06 HISTORY — DX: Unspecified abdominal pain: R10.9

## 2023-01-06 MED ORDER — NITROFURANTOIN MONOHYD MACRO 100 MG PO CAPS: 100.0000 mg | ORAL_CAPSULE | Freq: Two times a day (BID) | ORAL | 0 refills | Status: DC

## 2023-01-06 MED ORDER — TAMSULOSIN HCL 0.4 MG PO CAPS: 0.4000 mg | ORAL_CAPSULE | Freq: Every day | ORAL | 0 refills | Status: DC

## 2023-01-06 NOTE — Progress Notes (Signed)
Virtual Visit Encounter mychart visit.   I connected with  Karen Hebert on 01/06/23 at  8:15 AM EDT by secure video and audio telemedicine application. I verified that I am speaking with the correct person using two identifiers.   I introduced myself as a Publishing rights manager with the practice. The limitations of evaluation and management by telemedicine discussed with the patient and the availability of in person appointments. The patient expressed verbal understanding and consent to proceed.  Participating parties in this visit include: Myself and patient  The patient is: Patient Location: Other:  work I am: Provider Location: Office/Clinic Subjective:    CC and HPI: Karen Hebert is a 52 y.o. year old female presenting for new evaluation and treatment of acute left sided flank pain.  Karen Hebert presents today with the chief complaint of pain that she describes as achy, sometimes sharp, and radiating from the left flank to the front and down into the pelvis area. She notes that the pain comes in waves, sometimes worsening and then improving. Karen Hebert mentions that she has a strong family history of kidney stones on her mother's side of the family. She has never had this before. At this time, she feels the pain is improving, but she is leaving to go to [place] tomorrow and was concerned about making the long trip without being seen. She does not have UTI symptoms at this time.   Past medical history, Surgical history, Family history not pertinant except as noted below, Social history, Allergies, and medications have been entered into the medical record, reviewed, and corrections made.   Review of Systems:  All review of systems negative except what is listed in the HPI  Objective:    Alert and oriented x 4 Speaking in clear sentences with no shortness of breath. No distress.  Impression and Recommendations:    Problem List Items Addressed This Visit     Acute left flank  pain - Primary    Left sided flank pain appears to be resolving with residual dull ache. Symptoms are consistent with kidney stone. We discussed the nature of this and the typical treatments including increased water intake and optional flomax. She is leaving for a 13 hour trip tomorrow, therefore, I will go ahead and send in the flomax for her to start and macrobid to have on hand in the event she begins to have UTI symptoms. She is aware to contact me if the pain suddenly worsens, she is unable to void, or if she develops fever, chills, nausea, or vomiting.  Plan: - Will send flomax to use to assist with passage of suspected kidney stone. Take this once a day and be sure to drink plenty of water to help flush the stone. - If you start having UTI symptoms such as burning or odor in your urine, I have sent macrobid for you to have on hand. Hopefully this does not happen.  - Let me know if there are any changes or if this gets worse.       Relevant Medications   tamsulosin (FLOMAX) 0.4 MG CAPS capsule   nitrofurantoin, macrocrystal-monohydrate, (MACROBID) 100 MG capsule    orders and follow up as documented in EMR I discussed the assessment and treatment plan with the patient. The patient was provided an opportunity to ask questions and all were answered. The patient agreed with the plan and demonstrated an understanding of the instructions.   The patient was advised to call back or seek an in-person  evaluation if the symptoms worsen or if the condition fails to improve as anticipated.  Follow-Up: prn  I provided 16 minutes of non-face-to-face interaction with this non face-to-face encounter including intake, same-day documentation, and chart review.   Tollie Eth, NP , DNP, AGNP-c [facility]

## 2023-01-06 NOTE — Assessment & Plan Note (Signed)
Left sided flank pain appears to be resolving with residual dull ache. Symptoms are consistent with kidney stone. We discussed the nature of this and the typical treatments including increased water intake and optional flomax. She is leaving for a 13 hour trip tomorrow, therefore, I will go ahead and send in the flomax for her to start and macrobid to have on hand in the event she begins to have UTI symptoms. She is aware to contact me if the pain suddenly worsens, she is unable to void, or if she develops fever, chills, nausea, or vomiting.  Plan: - Will send flomax to use to assist with passage of suspected kidney stone. Take this once a day and be sure to drink plenty of water to help flush the stone. - If you start having UTI symptoms such as burning or odor in your urine, I have sent macrobid for you to have on hand. Hopefully this does not happen.  - Let me know if there are any changes or if this gets worse.

## 2023-01-12 ENCOUNTER — Ambulatory Visit: Payer: Self-pay

## 2023-01-12 ENCOUNTER — Observation Stay (HOSPITAL_COMMUNITY)
Admission: EM | Admit: 2023-01-12 | Discharge: 2023-01-13 | Disposition: A | Discharge status: Home / Self Care | Payer: Managed Care, Other (non HMO) | Attending: Emergency Medicine | Admitting: Emergency Medicine

## 2023-01-12 ENCOUNTER — Telehealth: Payer: Managed Care, Other (non HMO) | Admitting: Nurse Practitioner

## 2023-01-12 ENCOUNTER — Encounter (HOSPITAL_COMMUNITY): Payer: Self-pay | Admitting: *Deleted

## 2023-01-12 ENCOUNTER — Emergency Department (HOSPITAL_COMMUNITY): Payer: Managed Care, Other (non HMO)

## 2023-01-12 ENCOUNTER — Other Ambulatory Visit: Payer: Self-pay

## 2023-01-12 DIAGNOSIS — R7401 Elevation of levels of liver transaminase levels: Secondary | ICD-10-CM | POA: Diagnosis not present

## 2023-01-12 DIAGNOSIS — K5900 Constipation, unspecified: Secondary | ICD-10-CM | POA: Diagnosis present

## 2023-01-12 DIAGNOSIS — K5289 Other specified noninfective gastroenteritis and colitis: Principal | ICD-10-CM | POA: Diagnosis present

## 2023-01-12 DIAGNOSIS — E669 Obesity, unspecified: Secondary | ICD-10-CM | POA: Diagnosis present

## 2023-01-12 DIAGNOSIS — I10 Essential (primary) hypertension: Secondary | ICD-10-CM | POA: Insufficient documentation

## 2023-01-12 DIAGNOSIS — Z79899 Other long term (current) drug therapy: Secondary | ICD-10-CM | POA: Insufficient documentation

## 2023-01-12 DIAGNOSIS — K625 Hemorrhage of anus and rectum: Secondary | ICD-10-CM | POA: Insufficient documentation

## 2023-01-12 DIAGNOSIS — K922 Gastrointestinal hemorrhage, unspecified: Secondary | ICD-10-CM | POA: Insufficient documentation

## 2023-01-12 DIAGNOSIS — E1169 Type 2 diabetes mellitus with other specified complication: Secondary | ICD-10-CM

## 2023-01-12 DIAGNOSIS — E1165 Type 2 diabetes mellitus with hyperglycemia: Secondary | ICD-10-CM | POA: Diagnosis not present

## 2023-01-12 DIAGNOSIS — Z6833 Body mass index (BMI) 33.0-33.9, adult: Secondary | ICD-10-CM | POA: Diagnosis not present

## 2023-01-12 DIAGNOSIS — E1159 Type 2 diabetes mellitus with other circulatory complications: Secondary | ICD-10-CM | POA: Diagnosis present

## 2023-01-12 DIAGNOSIS — E782 Mixed hyperlipidemia: Secondary | ICD-10-CM

## 2023-01-12 DIAGNOSIS — I152 Hypertension secondary to endocrine disorders: Secondary | ICD-10-CM | POA: Diagnosis present

## 2023-01-12 LAB — CBC WITH DIFFERENTIAL/PLATELET
Abs Immature Granulocytes: 0.02 10*3/uL (ref 0.00–0.07)
Basophils Absolute: 0 10*3/uL (ref 0.0–0.1)
Basophils Relative: 0 %
Eosinophils Absolute: 0.2 10*3/uL (ref 0.0–0.5)
Eosinophils Relative: 2 %
HCT: 39 % (ref 36.0–46.0)
Hemoglobin: 13.2 g/dL (ref 12.0–15.0)
Immature Granulocytes: 0 %
Lymphocytes Relative: 26 %
Lymphs Abs: 2.5 10*3/uL (ref 0.7–4.0)
MCH: 28.1 pg (ref 26.0–34.0)
MCHC: 33.8 g/dL (ref 30.0–36.0)
MCV: 83 fL (ref 80.0–100.0)
Monocytes Absolute: 0.6 10*3/uL (ref 0.1–1.0)
Monocytes Relative: 7 %
Neutro Abs: 6.3 10*3/uL (ref 1.7–7.7)
Neutrophils Relative %: 65 %
Platelets: 289 10*3/uL (ref 150–400)
RBC: 4.7 MIL/uL (ref 3.87–5.11)
RDW: 13.4 % (ref 11.5–15.5)
WBC: 9.7 10*3/uL (ref 4.0–10.5)
nRBC: 0 % (ref 0.0–0.2)

## 2023-01-12 LAB — I-STAT CHEM 8, ED
BUN: 13 mg/dL (ref 6–20)
Calcium, Ion: 1.16 mmol/L (ref 1.15–1.40)
Chloride: 104 mmol/L (ref 98–111)
Creatinine, Ser: 0.8 mg/dL (ref 0.44–1.00)
Glucose, Bld: 108 mg/dL — ABNORMAL HIGH (ref 70–99)
HCT: 39 % (ref 36.0–46.0)
Hemoglobin: 13.3 g/dL (ref 12.0–15.0)
Potassium: 3.7 mmol/L (ref 3.5–5.1)
Sodium: 140 mmol/L (ref 135–145)
TCO2: 26 mmol/L (ref 22–32)

## 2023-01-12 LAB — COMPREHENSIVE METABOLIC PANEL
ALT: 108 U/L — ABNORMAL HIGH (ref 0–44)
AST: 69 U/L — ABNORMAL HIGH (ref 15–41)
Albumin: 3.9 g/dL (ref 3.5–5.0)
Alkaline Phosphatase: 111 U/L (ref 38–126)
Anion gap: 9 (ref 5–15)
BUN: 14 mg/dL (ref 6–20)
CO2: 25 mmol/L (ref 22–32)
Calcium: 8.8 mg/dL — ABNORMAL LOW (ref 8.9–10.3)
Chloride: 102 mmol/L (ref 98–111)
Creatinine, Ser: 0.79 mg/dL (ref 0.44–1.00)
GFR, Estimated: >60 mL/min (ref >60)
Glucose, Bld: 111 mg/dL — ABNORMAL HIGH (ref 70–99)
Potassium: 3.5 mmol/L (ref 3.5–5.1)
Sodium: 136 mmol/L (ref 135–145)
Total Bilirubin: 0.3 mg/dL (ref 0.3–1.2)
Total Protein: 7.3 g/dL (ref 6.5–8.1)

## 2023-01-12 LAB — URINALYSIS, ROUTINE W REFLEX MICROSCOPIC
Bilirubin Urine: NEGATIVE
Glucose, UA: NEGATIVE mg/dL
Hgb urine dipstick: NEGATIVE
Ketones, ur: NEGATIVE mg/dL
Leukocytes,Ua: NEGATIVE
Nitrite: NEGATIVE
Protein, ur: NEGATIVE mg/dL
Specific Gravity, Urine: 1.017 (ref 1.005–1.030)
pH: 7 (ref 5.0–8.0)

## 2023-01-12 MED ORDER — LIDOCAINE HCL URETHRAL/MUCOSAL 2 % EX GEL
1.0000 | Freq: Once | CUTANEOUS | Status: AC
  Administered 2023-01-12: 1
  Filled 2023-01-12: qty 10

## 2023-01-12 MED ORDER — MORPHINE SULFATE (PF) 4 MG/ML IV SOLN
4.0000 mg | Freq: Once | INTRAVENOUS | Status: AC
  Administered 2023-01-12: 4 mg via INTRAVENOUS
  Filled 2023-01-12: qty 1

## 2023-01-12 NOTE — ED Triage Notes (Signed)
Pt with c/o constipation, LBM a week ago.  Pt had a recent change to medication ozempic and constipation since.

## 2023-01-12 NOTE — ED Provider Notes (Signed)
[place] EMERGENCY DEPARTMENT AT [facility] Provider Note   CSN: [id number] Arrival date & time: 01/12/23  1755     History Chief Complaint  Patient presents with   Constipation    Karen Hebert is a 52 y.o. female.  Patient presents to the emergency department complaints of constipation.  She reports it has been about 1 week since she had a normal bowel movement but is still having some bowel movements just not as productive as typical for her baseline.  Reports that constipation coincides with increase of Ozempic dose up to 2 mg.  With the increase of Ozempic, patient is also increased frequency of use of Zofran as Ozempic causes nausea.  Denies any opiate use.  Reports that she has previously had some level of bowel blockage/obstruction that she reports was about 10 years ago.  Denies any significant abdominal tenderness.  Does have history of hemorrhoids and has been noticing some slight bleeding when she wipes.  Not currently using fiber supplement or daily MiraLAX.   Constipation      Home Medications Prior to Admission medications   Medication Sig Start Date End Date Taking? Authorizing Provider  blood glucose meter kit and supplies Dispense based on patient and insurance preference. Use up to four times daily as directed. (FOR ICD-10 E10.9, E11.9). 02/03/21   Tollie Eth, NP  Cholecalciferol (VITAMIN D3) 1.25 MG (50000 UT) TABS Take 1 tablet by mouth once a week. 12/22/22   Tollie Eth, NP  Multiple Vitamin (MULTIVITAMIN WITH MINERALS) TABS tablet Take 1 tablet by mouth daily.    [provider]  nitrofurantoin, macrocrystal-monohydrate, (MACROBID) 100 MG capsule Take 1 capsule (100 mg total) by mouth 2 (two) times daily. 01/06/23   Tollie Eth, NP  ondansetron (ZOFRAN) 8 MG tablet Take 1 tablet (8 mg total) by mouth every 8 (eight) hours as needed for nausea or vomiting. Take 1 tab (8mg ) every 8 hours as needed for nausea. 12/14/22   Tollie Eth, NP   Semaglutide, 2 MG/DOSE, 8 MG/3ML SOPN Inject 2 mg as directed once a week. 12/14/22   Tollie Eth, NP  simvastatin (ZOCOR) 5 MG tablet Take 1 tablet (5 mg total) by mouth at bedtime. Patient not taking: Reported on 12/14/2022 02/03/21   Early, Sung Amabile, NP  tamsulosin (FLOMAX) 0.4 MG CAPS capsule Take 1 capsule (0.4 mg total) by mouth daily. 01/06/23   Tollie Eth, NP      Allergies    Penicillins    Review of Systems   Review of Systems  Gastrointestinal:  Positive for constipation.  All other systems reviewed and are negative.   Physical Exam Updated Vital Signs BP (!) 169/104 (BP Location: Right Arm)   Pulse 86   Temp 99.2 F (37.3 C) (Oral)   Resp 20   Ht 5\' 8"  (1.727 m)   Wt 99.8 kg   SpO2 97%   BMI 33.45 kg/m  Physical Exam Vitals and nursing note reviewed.  Constitutional:      Appearance: Normal appearance.  HENT:     Head: Normocephalic and atraumatic.  Eyes:     Conjunctiva/sclera: Conjunctivae normal.  Cardiovascular:     Rate and Rhythm: Normal rate and regular rhythm.     Pulses: Normal pulses.     Heart sounds: Normal heart sounds.  Pulmonary:     Effort: Pulmonary effort is normal.     Breath sounds: Normal breath sounds.  Abdominal:  General: Abdomen is flat. Bowel sounds are normal. There is no distension.     Palpations: There is no mass.     Tenderness: There is no abdominal tenderness. There is no guarding.  Skin:    General: Skin is warm.     Capillary Refill: Capillary refill takes less than 2 seconds.     Findings: No erythema or rash.  Neurological:     Mental Status: She is alert.     ED Results / Procedures / Treatments   Labs (all labs ordered are listed, but only abnormal results are displayed) Labs Reviewed  COMPREHENSIVE METABOLIC PANEL  CBC WITH DIFFERENTIAL/PLATELET  URINALYSIS, ROUTINE W REFLEX MICROSCOPIC  I-STAT BETA HCG BLOOD, ED ([facility], [facility], [facility] ONLY)    EKG None  Radiology No results  found.  Procedures Procedures   Medications Ordered in ED Medications - No data to display  ED Course/ Medical Decision Making/ A&P                           Medical Decision Making Amount and/or Complexity of Data Reviewed Labs: ordered. Radiology: ordered.   This patient presents to the ED for concern of constipation.  Differential diagnosis includes bowel obstruction, medication induced constipation   Lab Tests:  I Ordered, and personally interpreted labs.  The pertinent results include:  ***   Imaging Studies ordered:  I ordered imaging studies including ***  I independently visualized and interpreted imaging which showed *** I agree with the radiologist interpretation   Medicines ordered and prescription drug management:  I ordered medication including ***  for ***  Reevaluation of the patient after these medicines showed that the patient {resolved/improved/worsened:23923::"improved"} I have reviewed the patients home medicines and have made adjustments as needed   Problem List / ED Course:  ***   Social Determinants of Health:       Final Clinical Impression(s) / ED Diagnoses Final diagnoses:  None    Rx / DC Orders ED Discharge Orders     None

## 2023-01-12 NOTE — Progress Notes (Signed)
Because of the symptoms you are experiencing, I feel your condition warrants further evaluation and I recommend that you be seen in a face to face visit.   NOTE: There will be NO CHARGE for this eVisit   If you are having a true medical emergency please call 911.      For an urgent face to face visit, [place] has eight urgent care centers for your convenience:   NEW!! [facility] Health Urgent Care Center at [facility] Get Driving Directions [phone] [address] [place], [address]    [facility] Health Urgent Care Center at [facility] Get Driving Directions [phone] [address] [place], [place] [address]   [facility] Health Urgent Care Center [facility]) Get Driving Directions [phone] [address] [place], [place] [address]  [facility] Health Urgent Care Center [facility]) Get Driving Directions [phone] [address] [place],  [place]  [address]  [facility] Health Urgent Care Center [facility] - at [employer]  [phone] [address] [place],  [place] [address]   [facility] Health Urgent Care at [facility] Get Driving Directions [phone] [address] [place], [place] [address]   [facility] Health Urgent Care at [facility] Get Driving Directions  [phone] [address] [place], [place] [address]   [facility] Health Urgent Care at [facility] Directions [phone] [address], [address] [place], [place] [address]  Your MyChart E-visit questionnaire answers were reviewed by a board certified advanced clinical practitioner to complete your personal care plan based on your specific symptoms.  Thank you for using e-Visits.

## 2023-01-13 ENCOUNTER — Other Ambulatory Visit: Payer: Self-pay

## 2023-01-13 ENCOUNTER — Telehealth: Payer: Self-pay | Admitting: Gastroenterology

## 2023-01-13 ENCOUNTER — Encounter (HOSPITAL_COMMUNITY): Payer: Self-pay | Admitting: Internal Medicine

## 2023-01-13 DIAGNOSIS — E1169 Type 2 diabetes mellitus with other specified complication: Secondary | ICD-10-CM

## 2023-01-13 DIAGNOSIS — E782 Mixed hyperlipidemia: Secondary | ICD-10-CM

## 2023-01-13 DIAGNOSIS — I10 Essential (primary) hypertension: Secondary | ICD-10-CM

## 2023-01-13 DIAGNOSIS — R7401 Elevation of levels of liver transaminase levels: Secondary | ICD-10-CM | POA: Insufficient documentation

## 2023-01-13 DIAGNOSIS — K5903 Drug induced constipation: Secondary | ICD-10-CM | POA: Diagnosis not present

## 2023-01-13 DIAGNOSIS — K5289 Other specified noninfective gastroenteritis and colitis: Secondary | ICD-10-CM | POA: Diagnosis not present

## 2023-01-13 DIAGNOSIS — K922 Gastrointestinal hemorrhage, unspecified: Secondary | ICD-10-CM | POA: Diagnosis not present

## 2023-01-13 DIAGNOSIS — E1165 Type 2 diabetes mellitus with hyperglycemia: Secondary | ICD-10-CM

## 2023-01-13 HISTORY — DX: Other specified noninfective gastroenteritis and colitis: K52.89

## 2023-01-13 HISTORY — DX: Elevation of levels of liver transaminase levels: R74.01

## 2023-01-13 HISTORY — DX: Gastrointestinal hemorrhage, unspecified: K92.2

## 2023-01-13 LAB — COMPREHENSIVE METABOLIC PANEL
ALT: 217 U/L — ABNORMAL HIGH (ref 0–44)
AST: 283 U/L — ABNORMAL HIGH (ref 15–41)
Albumin: 3.5 g/dL (ref 3.5–5.0)
Alkaline Phosphatase: 137 U/L — ABNORMAL HIGH (ref 38–126)
Anion gap: 8 (ref 5–15)
BUN: 13 mg/dL (ref 6–20)
CO2: 25 mmol/L (ref 22–32)
Calcium: 8.6 mg/dL — ABNORMAL LOW (ref 8.9–10.3)
Chloride: 104 mmol/L (ref 98–111)
Creatinine, Ser: 0.76 mg/dL (ref 0.44–1.00)
GFR, Estimated: >60 mL/min (ref >60)
Glucose, Bld: 133 mg/dL — ABNORMAL HIGH (ref 70–99)
Potassium: 3.4 mmol/L — ABNORMAL LOW (ref 3.5–5.1)
Sodium: 137 mmol/L (ref 135–145)
Total Bilirubin: 0.9 mg/dL (ref 0.3–1.2)
Total Protein: 6.6 g/dL (ref 6.5–8.1)

## 2023-01-13 LAB — HIV ANTIBODY (ROUTINE TESTING W REFLEX): HIV Screen 4th Generation wRfx: NONREACTIVE

## 2023-01-13 LAB — IRON AND TIBC
Iron: 38 ug/dL (ref 28–170)
Saturation Ratios: 10 % — ABNORMAL LOW (ref 10.4–31.8)
TIBC: 373 ug/dL (ref 250–450)
UIBC: 335 ug/dL

## 2023-01-13 LAB — CBC
HCT: 37.2 % (ref 36.0–46.0)
Hemoglobin: 12.5 g/dL (ref 12.0–15.0)
MCH: 28 pg (ref 26.0–34.0)
MCHC: 33.6 g/dL (ref 30.0–36.0)
MCV: 83.4 fL (ref 80.0–100.0)
Platelets: 252 10*3/uL (ref 150–400)
RBC: 4.46 MIL/uL (ref 3.87–5.11)
RDW: 13.6 % (ref 11.5–15.5)
WBC: 7 10*3/uL (ref 4.0–10.5)
nRBC: 0 % (ref 0.0–0.2)

## 2023-01-13 LAB — GLUCOSE, CAPILLARY
Glucose-Capillary: 107 mg/dL — ABNORMAL HIGH (ref 70–99)
Glucose-Capillary: 111 mg/dL — ABNORMAL HIGH (ref 70–99)

## 2023-01-13 LAB — FERRITIN: Ferritin: 41 ng/mL (ref 11–307)

## 2023-01-13 LAB — PHOSPHORUS: Phosphorus: 3.8 mg/dL (ref 2.5–4.6)

## 2023-01-13 LAB — MAGNESIUM: Magnesium: 2.5 mg/dL — ABNORMAL HIGH (ref 1.7–2.4)

## 2023-01-13 MED ORDER — LINACLOTIDE 72 MCG PO CAPS: 72.0000 ug | ORAL_CAPSULE | Freq: Every day | ORAL | 0 refills | Status: DC

## 2023-01-13 MED ORDER — FENTANYL CITRATE PF 50 MCG/ML IJ SOSY
25.0000 ug | PREFILLED_SYRINGE | Freq: Once | INTRAMUSCULAR | Status: AC
  Administered 2023-01-13: 25 ug via INTRAVENOUS
  Filled 2023-01-13: qty 1

## 2023-01-13 MED ORDER — METRONIDAZOLE 500 MG/100ML IV SOLN
500.0000 mg | Freq: Two times a day (BID) | INTRAVENOUS | Status: DC
  Administered 2023-01-13: 500 mg via INTRAVENOUS
  Filled 2023-01-13: qty 100

## 2023-01-13 MED ORDER — MILK AND MOLASSES ENEMA: 1.0000 | Freq: Once | RECTAL | Status: DC

## 2023-01-13 MED ORDER — CIPROFLOXACIN IN D5W 400 MG/200ML IV SOLN
400.0000 mg | Freq: Two times a day (BID) | INTRAVENOUS | Status: DC
  Administered 2023-01-13 (×2): 400 mg via INTRAVENOUS
  Filled 2023-01-13 (×2): qty 200

## 2023-01-13 MED ORDER — POLYETHYLENE GLYCOL 3350 17 G PO PACK
17.0000 g | PACK | Freq: Every day | ORAL | Status: DC
  Administered 2023-01-13: 17 g via ORAL
  Filled 2023-01-13: qty 1

## 2023-01-13 MED ORDER — SENNOSIDES-DOCUSATE SODIUM 8.6-50 MG PO TABS: 1.0000 | ORAL_TABLET | Freq: Every evening | ORAL | Status: DC | PRN

## 2023-01-13 MED ORDER — PEG 3350-KCL-NA BICARB-NACL 420 G PO SOLR
4000.0000 mL | Freq: Once | ORAL | Status: AC
  Administered 2023-01-13: 4000 mL via ORAL

## 2023-01-13 MED ORDER — HYDRALAZINE HCL 20 MG/ML IJ SOLN: 10.0000 mg | Freq: Four times a day (QID) | INTRAMUSCULAR | Status: DC | PRN

## 2023-01-13 MED ORDER — ONDANSETRON HCL 4 MG/2ML IJ SOLN: 4.0000 mg | Freq: Four times a day (QID) | INTRAMUSCULAR | Status: DC | PRN

## 2023-01-13 MED ORDER — ONDANSETRON HCL 4 MG PO TABS: 4.0000 mg | ORAL_TABLET | Freq: Four times a day (QID) | ORAL | Status: DC | PRN

## 2023-01-13 MED ORDER — ORAL CARE MOUTH RINSE: 15.0000 mL | OROMUCOSAL | Status: DC | PRN

## 2023-01-13 MED ORDER — FLEET ENEMA 7-19 GM/118ML RE ENEM
1.0000 | ENEMA | Freq: Once | RECTAL | Status: AC
  Administered 2023-01-13: 1 via RECTAL

## 2023-01-13 MED ORDER — POTASSIUM CHLORIDE CRYS ER 20 MEQ PO TBCR
40.0000 meq | EXTENDED_RELEASE_TABLET | Freq: Once | ORAL | Status: AC
  Administered 2023-01-13: 40 meq via ORAL
  Filled 2023-01-13: qty 2

## 2023-01-13 MED ORDER — METRONIDAZOLE 500 MG/100ML IV SOLN
500.0000 mg | Freq: Once | INTRAVENOUS | Status: AC
  Administered 2023-01-13: 500 mg via INTRAVENOUS
  Filled 2023-01-13: qty 100

## 2023-01-13 MED ORDER — ENOXAPARIN SODIUM 40 MG/0.4ML IJ SOSY: 40.0000 mg | PREFILLED_SYRINGE | INTRAMUSCULAR | Status: DC

## 2023-01-13 MED ORDER — SODIUM CHLORIDE 0.9 % IV SOLN: INTRAVENOUS | Status: AC

## 2023-01-13 MED ORDER — INSULIN ASPART 100 UNIT/ML IJ SOLN: 0.0000 [IU] | Freq: Three times a day (TID) | INTRAMUSCULAR | Status: DC

## 2023-01-13 NOTE — Telephone Encounter (Signed)
Lmom for pt to return my call.  

## 2023-01-13 NOTE — ED Notes (Signed)
Hemoccult positive 

## 2023-01-13 NOTE — H&P (Signed)
History and Physical    Patient: Karen Hebert ZOX:[id number] DOB: 01-09-1971 DOA: 01/12/2023 DOS: the patient was seen and examined on 01/13/2023 PCP: Tollie Eth, NP  Patient coming from: Home  Chief Complaint:  Chief Complaint  Patient presents with   Constipation   HPI: Karen Hebert is a 52 y.o. female with medical history significant of type 2 diabetes mellitus, hyperlipidemia, hypertension who presents to the emergency department due to 1 week onset of increased difficulty in defecating.  Patient states that though she was still able to have some bowel movements, but, she feels like she was unable to have complete bowel movement compared to baseline.  She states that her Ozempic dose was recently increased to 2 mg and she has also been using Zofran more frequently due to increased nausea. She denies any use of opioids, endorsed prior history of bowel obstruction (about 10 years ago) and history of hemorrhoids.  She denies any abdominal pain and denies use of laxatives or fiber supplement.  ED Course:  In the emergency department, BP was 169/104, other vital signs were within normal range.  Workup in the ED showed normal CBC and BMP except for blood glucose of 111, AST 69, ALT 108.  Urinalysis was normal except for cloudy appearance. CT abdomen and pelvis without contrast showed small to moderate-sized stool ball in the rectum with rectal wall thickening and presacral edema.  Findings are compatible with stercoral colitis.  Air-fluid levels throughout the remaining colon compatible with diarrheal disease.  Fatty infiltration of the liver.  Fibroid uterus. Patient was empirically started with IV ciprofloxacin and metronidazole, Fleet enema was given.  Hospitalist was asked to admit patient for further evaluation and management.  Review of Systems: Review of systems as noted in the HPI. All other systems reviewed and are negative.   Past Medical History:  Diagnosis Date    Diabetes mellitus without complication (HCC)    Hypertension    Past Surgical History:  Procedure Laterality Date   APPENDECTOMY     CHOLECYSTECTOMY      Social History:  reports that she has never smoked. She has never used smokeless tobacco. She reports that she does not currently use alcohol. She reports that she does not currently use drugs.   Allergies  Allergen Reactions   Penicillins Anaphylaxis    History reviewed. No pertinent family history.    Prior to Admission medications   Medication Sig Start Date End Date Taking? Authorizing Provider  blood glucose meter kit and supplies Dispense based on patient and insurance preference. Use up to four times daily as directed. (FOR ICD-10 E10.9, E11.9). 02/03/21   Tollie Eth, NP  Cholecalciferol (VITAMIN D3) 1.25 MG (50000 UT) TABS Take 1 tablet by mouth once a week. 12/22/22   Tollie Eth, NP  Multiple Vitamin (MULTIVITAMIN WITH MINERALS) TABS tablet Take 1 tablet by mouth daily.    [provider]  nitrofurantoin, macrocrystal-monohydrate, (MACROBID) 100 MG capsule Take 1 capsule (100 mg total) by mouth 2 (two) times daily. 01/06/23   Tollie Eth, NP  ondansetron (ZOFRAN) 8 MG tablet Take 1 tablet (8 mg total) by mouth every 8 (eight) hours as needed for nausea or vomiting. Take 1 tab (8mg ) every 8 hours as needed for nausea. 12/14/22   Tollie Eth, NP  Semaglutide, 2 MG/DOSE, 8 MG/3ML SOPN Inject 2 mg as directed once a week. 12/14/22   Tollie Eth, NP  simvastatin (ZOCOR) 5 MG tablet Take  1 tablet (5 mg total) by mouth at bedtime. Patient not taking: Reported on 12/14/2022 02/03/21   Early, Sung Amabile, NP  tamsulosin (FLOMAX) 0.4 MG CAPS capsule Take 1 capsule (0.4 mg total) by mouth daily. 01/06/23   Tollie Eth, NP    Physical Exam: BP (!) 169/91   Pulse 68   Temp 98.3 F (36.8 C)   Resp 18   Ht 5\' 8"  (1.727 m)   Wt 99.8 kg   SpO2 96%   BMI 33.45 kg/m   General: 52 y.o. year-old female well developed well  nourished in no acute distress.  Alert and oriented x3. HEENT: NCAT, EOMI Neck: Supple, trachea medial Cardiovascular: Regular rate and rhythm with no rubs or gallops.  No thyromegaly or JVD noted.  No lower extremity edema. 2/4 pulses in all 4 extremities. Respiratory: Clear to auscultation with no wheezes or rales. Good inspiratory effort. Abdomen: Soft, nontender nondistended with normal bowel sounds x4 quadrants. Muskuloskeletal: No cyanosis, clubbing or edema noted bilaterally Neuro: CN II-XII intact, strength 5/5 x 4, sensation, reflexes intact Skin: No ulcerative lesions noted or rashes Psychiatry: Judgement and insight appear normal. Mood is appropriate for condition and setting          Labs on Admission:  Basic Metabolic Panel: Recent Labs  Lab 01/12/23 2103 01/12/23 2110  NA 136 140  K 3.5 3.7  CL 102 104  CO2 25  --   GLUCOSE 111* 108*  BUN 14 13  CREATININE 0.79 0.80  CALCIUM 8.8*  --    Liver Function Tests: Recent Labs  Lab 01/12/23 2103  AST 69*  ALT 108*  ALKPHOS 111  BILITOT 0.3  PROT 7.3  ALBUMIN 3.9   No results for input(s): "LIPASE", "AMYLASE" in the last 168 hours. No results for input(s): "AMMONIA" in the last 168 hours. CBC: Recent Labs  Lab 01/12/23 2103 01/12/23 2110  WBC 9.7  --   NEUTROABS 6.3  --   HGB 13.2 13.3  HCT 39.0 39.0  MCV 83.0  --   PLT 289  --    Cardiac Enzymes: No results for input(s): "CKTOTAL", "CKMB", "CKMBINDEX", "TROPONINI" in the last 168 hours.  BNP (last 3 results) No results for input(s): "BNP" in the last 8760 hours.  ProBNP (last 3 results) No results for input(s): "PROBNP" in the last 8760 hours.  CBG: No results for input(s): "GLUCAP" in the last 168 hours.  Radiological Exams on Admission: CT ABDOMEN PELVIS WO CONTRAST  Result Date: 01/12/2023 CLINICAL DATA:  Evaluate for bowel obstruction. EXAM: CT ABDOMEN AND PELVIS WITHOUT CONTRAST TECHNIQUE: Multidetector CT imaging of the abdomen and  pelvis was performed following the standard protocol without IV contrast. RADIATION DOSE REDUCTION: This exam was performed according to the departmental dose-optimization program which includes automated exposure control, adjustment of the mA and/or kV according to patient size and/or use of iterative reconstruction technique. COMPARISON:  None Available. FINDINGS: Lower chest: No acute abnormality. Hepatobiliary: There is patchy fatty infiltration of the liver. Status post cholecystectomy. No biliary dilatation. Pancreas: Unremarkable. No pancreatic ductal dilatation or surrounding inflammatory changes. Spleen: Normal in size without focal abnormality. Adrenals/Urinary Tract: Adrenal glands are unremarkable. Kidneys are normal, without renal calculi, focal lesion, or hydronephrosis. Bladder is unremarkable. Stomach/Bowel: There is a small to moderate-sized stool ball in the rectum. There is some rectal wall thickening with presacral edema. There is no bowel obstruction. There is sigmoid colon diverticulosis. Air-fluid levels are seen throughout the remaining colon. Small bowel  and stomach are within normal limits. The appendix is not visualized. Vascular/Lymphatic: No significant vascular findings are present. No enlarged abdominal or pelvic lymph nodes. Reproductive: The uterus is mildly lobulated. Exophytic fibroid is seen anteriorly on the right measuring 2.6 cm. Ovaries are within normal limits. Other: No abdominal wall hernia or abnormality. No abdominopelvic ascites. Musculoskeletal: Degenerative changes affect the spine. IMPRESSION: 1. Small to moderate-sized stool ball in the rectum with rectal wall thickening and presacral edema. Findings are compatible with stercoral colitis. 2. Air-fluid levels throughout the remaining colon compatible with diarrheal disease. 3. Fatty infiltration of the liver. 4. Fibroid uterus. Electronically Signed   By: Darliss Cheney M.D.   On: 01/12/2023 22:04    EKG: I  independently viewed the EKG done and my findings are as followed: EKG was not done in the ED.  Assessment/Plan Present on Admission:  Stercoral colitis  Essential hypertension  Obesity (BMI 30-39.9)  Type 2 diabetes mellitus with hyperglycemia, without long-term current use of insulin (HCC)  Principal Problem:   Stercoral colitis Active Problems:   Essential hypertension   Obesity (BMI 30-39.9)   Type 2 diabetes mellitus with hyperglycemia, without long-term current use of insulin (HCC)   Transaminasemia   Mixed hyperlipidemia   GI bleed   Stercoral colitis Continue IV ciprofloxacin and metronidazole Continue IV hydration  Constipation Patient with fecal impaction.  Disimpaction was attempted in the ED without much success Fleet enema was given in the ED Continue MiraLAX, senna  GI bleed Patient complained of bright red blood per rectum after the fleets enema and using the bathroom.  She thinks the blood was from hemorrhoids. FOBT pending Gastroenterology will be consulted along with further recommendation  Transaminasemia AST 69, ALT 103 This is possibly secondary to fatty infiltration of the liver noted on CT abdomen and pelvis Continue to monitor liver enzymes  Essential hypertension (uncontrolled) Continue IV hydralazine 10 mg every 6 hours as needed for SBP > 170  Type 2 diabetes mellitus Continue ISS and hypoglycemia protocol  Mixed hyperlipidemia Statin will be held at this time due to transaminasemia  Obesity (BMI 33.45) Diet and lifestyle modification  DVT prophylaxis: SCDs  Advance Care Planning: Full code  Consults: None  Family Communication: None at bedside  Severity of Illness: The appropriate patient status for this patient is OBSERVATION. Observation status is judged to be reasonable and necessary in order to provide the required intensity of service to ensure the patient's safety. The patient's presenting symptoms, physical exam findings,  and initial radiographic and laboratory data in the context of their medical condition is felt to place them at decreased risk for further clinical deterioration. Furthermore, it is anticipated that the patient will be medically stable for discharge from the hospital within 2 midnights of admission.   Author: Frankey Shown, DO 01/13/2023 2:02 AM  For on call review [email or web address].

## 2023-01-13 NOTE — TOC Progression Note (Signed)
  Transition of Care [facility]) Screening Note   Patient Details  Name: Karen Hebert Date of Birth: 1971-04-03   Transition of Care [facility]) CM/SW Contact:    Elliot Gault, LCSW Phone Number: 01/13/2023, 10:28 AM    Transition of Care Department [facility]) has reviewed patient and no TOC needs have been identified at this time. We will continue to monitor patient advancement through interdisciplinary progression rounds. If new patient transition needs arise, please place a TOC consult.

## 2023-01-13 NOTE — Plan of Care (Signed)
  Problem: Elimination: Goal: Will not experience complications related to bowel motility Outcome: Adequate for Discharge   Problem: Education: Goal: Knowledge of General Education information will improve Description: Including pain rating scale, medication(s)/side effects and non-pharmacologic comfort measures Outcome: Completed/Met   Problem: Health Behavior/Discharge Planning: Goal: Ability to manage health-related needs will improve Outcome: Completed/Met   Problem: Clinical Measurements: Goal: Ability to maintain clinical measurements within normal limits will improve Outcome: Completed/Met Goal: Will remain free from infection Outcome: Completed/Met Goal: Diagnostic test results will improve Outcome: Completed/Met Goal: Respiratory complications will improve Outcome: Completed/Met Goal: Cardiovascular complication will be avoided Outcome: Completed/Met   Problem: Activity: Goal: Risk for activity intolerance will decrease Outcome: Completed/Met   Problem: Nutrition: Goal: Adequate nutrition will be maintained Outcome: Completed/Met   Problem: Coping: Goal: Level of anxiety will decrease Outcome: Completed/Met   Problem: Elimination: Goal: Will not experience complications related to urinary retention Outcome: Completed/Met   Problem: Pain Managment: Goal: General experience of comfort will improve Outcome: Completed/Met   Problem: Safety: Goal: Ability to remain free from injury will improve Outcome: Completed/Met   Problem: Skin Integrity: Goal: Risk for impaired skin integrity will decrease Outcome: Completed/Met   Problem: Education: Goal: Ability to describe self-care measures that may prevent or decrease complications (Diabetes Survival Skills Education) will improve Outcome: Completed/Met Goal: Individualized Educational Video(s) Outcome: Completed/Met   Problem: Coping: Goal: Ability to adjust to condition or change in health will  improve Outcome: Completed/Met   Problem: Fluid Volume: Goal: Ability to maintain a balanced intake and output will improve Outcome: Completed/Met   Problem: Health Behavior/Discharge Planning: Goal: Ability to identify and utilize available resources and services will improve Outcome: Completed/Met Goal: Ability to manage health-related needs will improve Outcome: Completed/Met   Problem: Metabolic: Goal: Ability to maintain appropriate glucose levels will improve Outcome: Completed/Met   Problem: Nutritional: Goal: Maintenance of adequate nutrition will improve Outcome: Completed/Met Goal: Progress toward achieving an optimal weight will improve Outcome: Completed/Met   Problem: Skin Integrity: Goal: Risk for impaired skin integrity will decrease Outcome: Completed/Met   Problem: Tissue Perfusion: Goal: Adequacy of tissue perfusion will improve Outcome: Completed/Met

## 2023-01-13 NOTE — Progress Notes (Signed)
Karen Hebert is a 52 y.o. female with medical history significant of type 2 diabetes mellitus, hyperlipidemia, hypertension who presents to the emergency department due to 1 week onset of increased difficulty in defecating.  Patient was admitted for severe constipation with possible stercoral colitis and some rectal bleeding.  She is also noted to have some elevated LFTs.  Patient seen and evaluated at bedside and she was admitted after midnight.  She was seen by GI with recommendations to give bowel prep.  IV antibiotics have been discontinued as there does not appear to be a clear indication for this.  LFTs are elevated and will need close monitoring.  Anticipate discharge in the next 24-48 hours pending improvement in symptomatology.  Total care time: 25 minutes.

## 2023-01-13 NOTE — ED Notes (Signed)
NO results from fleets enema. Pt states she noticed some blood

## 2023-01-13 NOTE — ED Notes (Signed)
Chaperon provider for disimpaction with no good results

## 2023-01-13 NOTE — Telephone Encounter (Signed)
Patient needs hospital follow up with me in 2 weeks.  Tammy, she will need LFTs in one week. Please arrange. Provide patient instructions.

## 2023-01-13 NOTE — ED Notes (Signed)
Hospitalist in to see pt.

## 2023-01-13 NOTE — Progress Notes (Signed)
Went over discharge instructions with patient.

## 2023-01-13 NOTE — Consult Note (Signed)
Gastroenterology Consult   Referring Provider: No ref. provider found Primary Care Physician:  Early, Sung Amabile, NP Primary Gastroenterologist:  has been seen previously near [facility] but relocated to the area 3 years ago  Patient ID: Karen Hebert; [id number]; August 06, 1971   Admit date: 01/12/2023  LOS: 0 days   Date of Consultation: 01/13/2023  Reason for Consultation:  GI bleed    History of Present Illness   Karen Hebert is a 52 y.o. female with history of diabetes and hypertension.  Presenting to the emergency department with complaints of constipation.  Patient reported no normal bowel movement for over a week, having some stool but not productive. Has baseline constipation her entire life but symptoms worse recently with increase of Ozempic from 1mg  to 2mg  three weeks ago. She has also been using more Zofran than before due to nausea.  At baseline, she takes magnesium supplement to help with constipation. If she eats appropriately, drinks plenty of fluids, she can often have daily BM but if anything interrupts this pattern she can go days without a stool. She reports history of internal/external hemorrhoids. States she has tried everything OTC for constipation but no prescriptions. She has issues with hemorrhoid swelling and bleeding at times. She showed a picture of her hemorrhoids from yesterday, fairly significant external disease noted. She also reports that a couple of weeks ago she was having some left and right flank pain, thought it was her kidneys. Has had some vague abdominal pain, more lower, she contributed to her constipation. She denies ruq pain.   Used to see GI near [facility] until she relocated to the area about 3 years ago. Last EGD/colonoscopy about 3 years ago. She denies polyps. States she had something in her esophagus, maybe erosions/ulcers and was given medication to heal it. She takes omeprazole daily for chronic GERD with good control of heartburn but  still has reflux at times. Occasional dysphagia.    In the ED CT abdomen pelvis without contrast showed small to moderate-sized stool ball in the rectum with rectal wall thickening and presacral edema.  Findings compatible with stercoral colitis.  Air-fluid levels throughout the remaining colon compatible with diarrheal illness.  Also with fatty liver and fibroid uterus.  Patient empirically started on IV Cipro and metronidazole.  Fleet enema given.  Disimpaction attempted in the ED without much success.  Hard stool noted in the rectal vault.  Labs yesterday with creatinine of 0.79, calcium 8.8, AST 69, ALT 108 (1 month prior her AST was 23, ALT was 38), alk phos 111, total bilirubin 0.3, hemoglobin 13.2.  Today her AST is 283, ALT 217, alk phos 137, total bilirubin 0.9, hemoglobin 12.5   Prior to Admission medications   Medication Sig Start Date End Date Taking? Authorizing Provider  blood glucose meter kit and supplies Dispense based on patient and insurance preference. Use up to four times daily as directed. (FOR ICD-10 E10.9, E11.9). 02/03/21   Tollie Eth, NP  Cholecalciferol (VITAMIN D3) 1.25 MG (50000 UT) TABS Take 1 tablet by mouth once a week. 12/22/22   Tollie Eth, NP  Multiple Vitamin (MULTIVITAMIN WITH MINERALS) TABS tablet Take 1 tablet by mouth daily.    [provider]  nitrofurantoin, macrocrystal-monohydrate, (MACROBID) 100 MG capsule Take 1 capsule (100 mg total) by mouth 2 (two) times daily. 01/06/23   Tollie Eth, NP  ondansetron (ZOFRAN) 8 MG tablet Take 1 tablet (8 mg total) by mouth every 8 (  eight) hours as needed for nausea or vomiting. Take 1 tab (8mg ) every 8 hours as needed for nausea. 12/14/22   Tollie Eth, NP  Semaglutide, 2 MG/DOSE, 8 MG/3ML SOPN Inject 2 mg as directed once a week. 12/14/22   Tollie Eth, NP  simvastatin (ZOCOR) 5 MG tablet Take 1 tablet (5 mg total) by mouth at bedtime. Patient not taking: Reported on 12/14/2022 02/03/21   Early, Sung Amabile, NP   tamsulosin (FLOMAX) 0.4 MG CAPS capsule Take 1 capsule (0.4 mg total) by mouth daily. 01/06/23   Tollie Eth, NP  Omeprazole daily  Current Facility-Administered Medications  Medication Dose Route Frequency Provider Last Rate Last Admin   0.9 %  sodium chloride infusion   Intravenous Continuous Adefeso, Oladapo, DO 100 mL/hr at 01/13/23 0323 New Bag at 01/13/23 0323   ciprofloxacin (CIPRO) IVPB 400 mg  400 mg Intravenous Q12H Smitty Knudsen, PA-C 200 mL/hr at 01/13/23 0125 400 mg at 01/13/23 0125   hydrALAZINE (APRESOLINE) injection 10 mg  10 mg Intravenous Q6H PRN Adefeso, Oladapo, DO       insulin aspart (novoLOG) injection 0-15 Units  0-15 Units Subcutaneous TID WC Adefeso, Oladapo, DO       metroNIDAZOLE (FLAGYL) IVPB 500 mg  500 mg Intravenous Q12H Adefeso, Oladapo, DO 100 mL/hr at 01/13/23 0931 500 mg at 01/13/23 0931   ondansetron (ZOFRAN) tablet 4 mg  4 mg Oral Q6H PRN Adefeso, Oladapo, DO       Or   ondansetron (ZOFRAN) injection 4 mg  4 mg Intravenous Q6H PRN Adefeso, Oladapo, DO       Oral care mouth rinse  15 mL Mouth Rinse PRN Sherryll Burger, Pratik D, DO       polyethylene glycol (MIRALAX / GLYCOLAX) packet 17 g  17 g Oral Daily Adefeso, Oladapo, DO   17 g at 01/13/23 1610   polyethylene glycol-electrolytes (NuLYTELY) solution 4,000 mL  4,000 mL Oral Once Marguerita Merles, Reuel Boom, MD       senna-docusate (Senokot-S) tablet 1 tablet  1 tablet Oral QHS PRN Frankey Shown, DO        Allergies as of 01/12/2023 - Review Complete 01/12/2023  Allergen Reaction Noted   Penicillins Anaphylaxis 12/02/2020    Past Medical History:  Diagnosis Date   Diabetes mellitus without complication (HCC)    Hypertension     Past Surgical History:  Procedure Laterality Date   APPENDECTOMY     CHOLECYSTECTOMY      Family History  Adopted: Yes  Family history unknown: Yes    Social History   Socioeconomic History   Marital status: Married    Spouse name: Not on file   Number of  children: Not on file   Years of education: Not on file   Highest education level: Not on file  Occupational History   Not on file  Tobacco Use   Smoking status: Never   Smokeless tobacco: Never  Substance and Sexual Activity   Alcohol use: Not Currently   Drug use: Not Currently   Sexual activity: Not on file  Other Topics Concern   Not on file  Social History Narrative   Not on file   Social Determinants of Health   Financial Resource Strain: Not on file  Food Insecurity: No Food Insecurity (01/13/2023)   Hunger Vital Sign    Worried About Running Out of Food in the Last Year: Never true    Ran Out of Food in the Last Year:  Never true  Transportation Needs: No Transportation Needs (01/13/2023)   PRAPARE - Administrator, Civil Service (Medical): No    Lack of Transportation (Non-Medical): No  Physical Activity: Not on file  Stress: Not on file  Social Connections: Not on file  Intimate Partner Violence: Not At Risk (01/13/2023)   Humiliation, Afraid, Rape, and Kick questionnaire    Fear of Current or Ex-Partner: No    Emotionally Abused: No    Physically Abused: No    Sexually Abused: No     Review of System:   General: Negative for anorexia, weight loss, fever, chills, fatigue, weakness. Eyes: Negative for vision changes.  ENT: Negative for hoarseness, difficulty swallowing , nasal congestion. CV: Negative for chest pain, angina, palpitations, dyspnea on exertion, peripheral edema.  Respiratory: Negative for dyspnea at rest, dyspnea on exertion, cough, sputum, wheezing.  GI: See history of present illness. GU:  Negative for dysuria, hematuria, urinary incontinence, urinary frequency, nocturnal urination.  MS: Negative for joint pain, low back pain.  Derm: Negative for rash or itching.  Neuro: Negative for weakness, abnormal sensation, seizure, frequent headaches, memory loss, confusion.  Psych: Negative for anxiety, depression, suicidal ideation,  hallucinations.  Endo: Negative for unusual weight change.  Heme: Negative for bruising or bleeding. Allergy: Negative for rash or hives.      Physical Examination:   Vital signs in last 24 hours: Temp:  [98.2 F (36.8 C)-99.2 F (37.3 C)] 98.4 F (36.9 C) (05/16 1010) Pulse Rate:  [58-86] 64 (05/16 1010) Resp:  [16-20] 17 (05/16 1010) BP: (128-185)/(79-104) 129/85 (05/16 1010) SpO2:  [94 %-100 %] 97 % (05/16 1010) Weight:  [99.7 kg-99.8 kg] 99.7 kg (05/16 0211)    General: Well-nourished, well-developed in no acute distress.  Head: Normocephalic, atraumatic.   Eyes: Conjunctiva pink, no icterus. Mouth: Oropharyngeal mucosa moist and pink  Neck: Supple without thyromegaly, masses, or lymphadenopathy.  Lungs: Clear to auscultation bilaterally.  Heart: Regular rate and rhythm, no murmurs rubs or gallops.  Abdomen: Bowel sounds are normal, nontender, nondistended, no hepatosplenomegaly or masses, no abdominal bruits or hernia , no rebound or guarding.   Rectal: not performed Extremities: No lower extremity edema, clubbing, deformity.  Neuro: Alert and oriented x 4 , grossly normal neurologically.  Skin: Warm and dry, no rash or jaundice.   Psych: Alert and cooperative, normal mood and affect.        Intake/Output from previous day: 05/15 0701 - 05/16 0700 In: 243 [I.V.:0; IV Piggyback:243] Out: -  Intake/Output this shift: No intake/output data recorded.  Lab Results:   CBC Recent Labs    01/12/23 2103 01/12/23 2110 01/13/23 0453  WBC 9.7  --  7.0  HGB 13.2 13.3 12.5  HCT 39.0 39.0 37.2  MCV 83.0  --  83.4  PLT 289  --  252   BMET Recent Labs    01/12/23 2103 01/12/23 2110 01/13/23 0453  NA 136 140 137  K 3.5 3.7 3.4*  CL 102 104 104  CO2 25  --  25  GLUCOSE 111* 108* 133*  BUN 14 13 13   CREATININE 0.79 0.80 0.76  CALCIUM 8.8*  --  8.6*   LFT Recent Labs    01/12/23 2103 01/13/23 0453  BILITOT 0.3 0.9  ALKPHOS 111 137*  AST 69* 283*  ALT 108*  217*  PROT 7.3 6.6  ALBUMIN 3.9 3.5    Lipase No results for input(s): "LIPASE" in the last 72 hours.  PT/INR No  results for input(s): "LABPROT", "INR" in the last 72 hours.   Hepatitis Panel No results for input(s): "HEPBSAG", "HCVAB", "HEPAIGM", "HEPBIGM" in the last 72 hours.   Imaging Studies:   CT ABDOMEN PELVIS WO CONTRAST  Result Date: 01/12/2023 CLINICAL DATA:  Evaluate for bowel obstruction. EXAM: CT ABDOMEN AND PELVIS WITHOUT CONTRAST TECHNIQUE: Multidetector CT imaging of the abdomen and pelvis was performed following the standard protocol without IV contrast. RADIATION DOSE REDUCTION: This exam was performed according to the departmental dose-optimization program which includes automated exposure control, adjustment of the mA and/or kV according to patient size and/or use of iterative reconstruction technique. COMPARISON:  None Available. FINDINGS: Lower chest: No acute abnormality. Hepatobiliary: There is patchy fatty infiltration of the liver. Status post cholecystectomy. No biliary dilatation. Pancreas: Unremarkable. No pancreatic ductal dilatation or surrounding inflammatory changes. Spleen: Normal in size without focal abnormality. Adrenals/Urinary Tract: Adrenal glands are unremarkable. Kidneys are normal, without renal calculi, focal lesion, or hydronephrosis. Bladder is unremarkable. Stomach/Bowel: There is a small to moderate-sized stool ball in the rectum. There is some rectal wall thickening with presacral edema. There is no bowel obstruction. There is sigmoid colon diverticulosis. Air-fluid levels are seen throughout the remaining colon. Small bowel and stomach are within normal limits. The appendix is not visualized. Vascular/Lymphatic: No significant vascular findings are present. No enlarged abdominal or pelvic lymph nodes. Reproductive: The uterus is mildly lobulated. Exophytic fibroid is seen anteriorly on the right measuring 2.6 cm. Ovaries are within normal limits.  Other: No abdominal wall hernia or abnormality. No abdominopelvic ascites. Musculoskeletal: Degenerative changes affect the spine. IMPRESSION: 1. Small to moderate-sized stool ball in the rectum with rectal wall thickening and presacral edema. Findings are compatible with stercoral colitis. 2. Air-fluid levels throughout the remaining colon compatible with diarrheal disease. 3. Fatty infiltration of the liver. 4. Fibroid uterus. Electronically Signed   By: Darliss Cheney M.D.   On: 01/12/2023 22:04  [4 week]  Assessment:   52 y/o female with DM, HTN presenting with complaints of constipation.  GI consulted for rectal bleeding.  Constipation/rectal bleeding: Chronic lifelong baseline constipation, typically manages with diet, over-the-counter magnesium supplement.  Has tried multiple over-the-counter regimens in the past with little results.  Recently had worsening of constipation in the setting of increased Ozempic from 1 mg to 2 mg 3 weeks ago.  She is also been taking Zofran more regularly due to nausea.  Chronic constipation exacerbated by use of Ozempic and Zofran. CT this admission with small to moderate-sized stool ball in the rectum with rectal wall thickening and presacral edema consistent with stercoral colitis.  She also had air-fluid levels throughout the remaining colon with concern for diarrheal disease but she is not presenting clinically with colitis.  Noted to have rectal bleeding after fleets enema administered yesterday.  Patient reports her external hemorrhoids were inflamed and swollen, somewhat improved today.  Suspect rectal bleeding from benign anorectal source.  Patient reports last colonoscopy about 3 years ago, unremarkable.   Elevated LFTs: Mild bump in ALT noted April 2024.  LFTs have been normal in 2022.  Yesterday her AST was up to 69, ALT 108.  Today her AST is 283, ALT 217, alk phos 137.  CT her liver had patchy fatty infiltration.  Denies any right upper quadrant pain.  Only  recent medication change was increasing Ozempic.  Denies any herbal medications.  Denies any ill contacts. Etiology unclear, ?medication induced.  Plan:   Consider milk of molasses enema. Bowel prep  ordered.  It is not clear that IV cipro/flagyl needed at this time for colitis.  Trend LFTs.    LOS: 0 days   We would like to thank you for the opportunity to participate in the care of Karen Hebert.  Leanna Battles. [place] [facility] [facility] [phone] 5/16/202410:40 AM

## 2023-01-13 NOTE — Discharge Summary (Signed)
Physician Discharge Summary  Karen Hebert ZOX:[id number] DOB: 1971-08-25 DOA: 01/12/2023  PCP: Tollie Eth, NP  Admit date: 01/12/2023  Discharge date: 01/13/2023  Admitted From:Home  Disposition:  Home  Recommendations for Outpatient Follow-up:  Follow up with PCP in 1-2 weeks Follow-up CMP in 1 week to trend LFTs Follow-up with GI outpatient which will be arranged Avoid further use of Ozempic until further follow-up with PCP Continue on Linzess as prescribed  Home Health: None  Equipment/Devices: None  Discharge Condition:Stable  CODE STATUS: Full  Diet recommendation: Heart Healthy/carb modified  Brief/Interim Summary:  Karen Hebert is a 52 y.o. female with medical history significant of type 2 diabetes mellitus, hyperlipidemia, hypertension who presents to the emergency department due to 1 week onset of increased difficulty in defecating.  Patient was admitted for severe constipation with possible stercoral colitis and some rectal bleeding.  She is also noted to have some elevated LFTs.  She was seen by GI and had colon prep and had a significant bowel movement and felt much better.  She is now stable for discharge and has stable CBC and no further bleeding noted.  She will follow-up with GI outpatient in the next 2 weeks and will need to continue to trend LFTs outpatient.  Other medication adjustments as noted above with no other acute events noted.  Discharge Diagnoses:  Principal Problem:   Stercoral colitis Active Problems:   Essential hypertension   Obesity (BMI 30-39.9)   Type 2 diabetes mellitus with hyperglycemia, without long-term current use of insulin (HCC)   Transaminasemia   Mixed hyperlipidemia   GI bleed  Principal discharge diagnosis: Severe constipation with stercoral colitis and rectal bleeding.  Discharge Instructions  Discharge Instructions     Diet - low sodium heart healthy   Complete by: As directed    Increase activity slowly    Complete by: As directed       Allergies as of 01/13/2023       Reactions   Penicillins Anaphylaxis        Medication List     STOP taking these medications    Semaglutide (2 MG/DOSE) 8 MG/3ML Sopn       TAKE these medications    cholecalciferol 25 MCG (1000 UNIT) tablet Commonly known as: VITAMIN D3 Take 1,000 Units by mouth daily.   linaclotide 72 MCG capsule Commonly known as: Linzess Take 1 capsule (72 mcg total) by mouth daily before breakfast.   multivitamin with minerals Tabs tablet Take 1 tablet by mouth daily.   ondansetron 8 MG tablet Commonly known as: Zofran Take 1 tablet (8 mg total) by mouth every 8 (eight) hours as needed for nausea or vomiting. Take 1 tab (8mg ) every 8 hours as needed for nausea.        Follow-up Information     Early, Sung Amabile, NP. Schedule an appointment as soon as possible for a visit in 1 week(s).   Specialty: Nurse Practitioner Contact information: [address] [place] [address] [phone]         [facility]. Go to.   Contact information: [address] [place] [address] [phone]               Allergies  Allergen Reactions   Penicillins Anaphylaxis    Consultations: GI   Procedures/Studies: CT ABDOMEN PELVIS WO CONTRAST  Result Date: 01/12/2023 CLINICAL DATA:  Evaluate for bowel obstruction. EXAM: CT ABDOMEN AND PELVIS WITHOUT CONTRAST TECHNIQUE: Multidetector CT imaging  of the abdomen and pelvis was performed following the standard protocol without IV contrast. RADIATION DOSE REDUCTION: This exam was performed according to the departmental dose-optimization program which includes automated exposure control, adjustment of the mA and/or kV according to patient size and/or use of iterative reconstruction technique. COMPARISON:  None Available. FINDINGS: Lower chest: No acute abnormality. Hepatobiliary: There is patchy fatty infiltration of  the liver. Status post cholecystectomy. No biliary dilatation. Pancreas: Unremarkable. No pancreatic ductal dilatation or surrounding inflammatory changes. Spleen: Normal in size without focal abnormality. Adrenals/Urinary Tract: Adrenal glands are unremarkable. Kidneys are normal, without renal calculi, focal lesion, or hydronephrosis. Bladder is unremarkable. Stomach/Bowel: There is a small to moderate-sized stool ball in the rectum. There is some rectal wall thickening with presacral edema. There is no bowel obstruction. There is sigmoid colon diverticulosis. Air-fluid levels are seen throughout the remaining colon. Small bowel and stomach are within normal limits. The appendix is not visualized. Vascular/Lymphatic: No significant vascular findings are present. No enlarged abdominal or pelvic lymph nodes. Reproductive: The uterus is mildly lobulated. Exophytic fibroid is seen anteriorly on the right measuring 2.6 cm. Ovaries are within normal limits. Other: No abdominal wall hernia or abnormality. No abdominopelvic ascites. Musculoskeletal: Degenerative changes affect the spine. IMPRESSION: 1. Small to moderate-sized stool ball in the rectum with rectal wall thickening and presacral edema. Findings are compatible with stercoral colitis. 2. Air-fluid levels throughout the remaining colon compatible with diarrheal disease. 3. Fatty infiltration of the liver. 4. Fibroid uterus. Electronically Signed   By: Darliss Cheney M.D.   On: 01/12/2023 22:04     Discharge Exam: Vitals:   01/13/23 1010 01/13/23 1322  BP: 129/85 (!) 140/99  Pulse: 64 70  Resp: 17 18  Temp: 98.4 F (36.9 C) 98.7 F (37.1 C)  SpO2: 97% 98%   Vitals:   01/13/23 0211 01/13/23 0614 01/13/23 1010 01/13/23 1322  BP: (!) 185/97 128/79 129/85 (!) 140/99  Pulse: 65 60 64 70  Resp: 16 18 17 18   Temp: 98.2 F (36.8 C) 98.2 F (36.8 C) 98.4 F (36.9 C) 98.7 F (37.1 C)  TempSrc: Oral Oral Oral Oral  SpO2: 100% 97% 97% 98%  Weight:  99.7 kg     Height:        General: Pt is alert, awake, not in acute distress Cardiovascular: RRR, S1/S2 +, no rubs, no gallops Respiratory: CTA bilaterally, no wheezing, no rhonchi Abdominal: Soft, NT, ND, bowel sounds + Extremities: no edema, no cyanosis    The results of significant diagnostics from this hospitalization (including imaging, microbiology, ancillary and laboratory) are listed below for reference.     Microbiology: No results found for this or any previous visit (from the past 240 hour(s)).   Labs: BNP (last 3 results) No results for input(s): "BNP" in the last 8760 hours. Basic Metabolic Panel: Recent Labs  Lab 01/12/23 2103 01/12/23 2110 01/13/23 0453  NA 136 140 137  K 3.5 3.7 3.4*  CL 102 104 104  CO2 25  --  25  GLUCOSE 111* 108* 133*  BUN 14 13 13   CREATININE 0.79 0.80 0.76  CALCIUM 8.8*  --  8.6*  MG  --   --  2.5*  PHOS  --   --  3.8   Liver Function Tests: Recent Labs  Lab 01/12/23 2103 01/13/23 0453  AST 69* 283*  ALT 108* 217*  ALKPHOS 111 137*  BILITOT 0.3 0.9  PROT 7.3 6.6  ALBUMIN 3.9 3.5   No  results for input(s): "LIPASE", "AMYLASE" in the last 168 hours. No results for input(s): "AMMONIA" in the last 168 hours. CBC: Recent Labs  Lab 01/12/23 2103 01/12/23 2110 01/13/23 0453  WBC 9.7  --  7.0  NEUTROABS 6.3  --   --   HGB 13.2 13.3 12.5  HCT 39.0 39.0 37.2  MCV 83.0  --  83.4  PLT 289  --  252   Cardiac Enzymes: No results for input(s): "CKTOTAL", "CKMB", "CKMBINDEX", "TROPONINI" in the last 168 hours. BNP: Invalid input(s): "POCBNP" CBG: Recent Labs  Lab 01/13/23 0724 01/13/23 1146  GLUCAP 107* 111*   D-Dimer No results for input(s): "DDIMER" in the last 72 hours. Hgb A1c No results for input(s): "HGBA1C" in the last 72 hours. Lipid Profile No results for input(s): "CHOL", "HDL", "LDLCALC", "TRIG", "CHOLHDL", "LDLDIRECT" in the last 72 hours. Thyroid function studies No results for input(s): "TSH",  "T4TOTAL", "T3FREE", "THYROIDAB" in the last 72 hours.  Invalid input(s): "FREET3" Anemia work up Recent Labs    01/13/23 0453  FERRITIN 41  TIBC 373  IRON 38   Urinalysis    Component Value Date/Time   COLORURINE YELLOW 01/12/2023 2118   APPEARANCEUR CLOUDY (A) 01/12/2023 2118   LABSPEC 1.017 01/12/2023 2118   PHURINE 7.0 01/12/2023 2118   GLUCOSEU NEGATIVE 01/12/2023 2118   HGBUR NEGATIVE 01/12/2023 2118   BILIRUBINUR NEGATIVE 01/12/2023 2118   KETONESUR NEGATIVE 01/12/2023 2118   PROTEINUR NEGATIVE 01/12/2023 2118   NITRITE NEGATIVE 01/12/2023 2118   LEUKOCYTESUR NEGATIVE 01/12/2023 2118   Sepsis Labs Recent Labs  Lab 01/12/23 2103 01/13/23 0453  WBC 9.7 7.0   Microbiology No results found for this or any previous visit (from the past 240 hour(s)).   Time coordinating discharge: 35 minutes  SIGNED:   Erick Blinks, DO [facility] 01/13/2023, 3:20 PM  If 7PM-7AM, please contact night-coverage [email or web address]

## 2023-01-14 LAB — HCV INTERPRETATION

## 2023-01-14 NOTE — Telephone Encounter (Signed)
Lab was ordered and pt was sent a message through myChart letting her know when and where to go to have done.

## 2023-01-17 ENCOUNTER — Telehealth: Payer: Self-pay

## 2023-01-17 NOTE — Transitions of Care (Post Inpatient/ED Visit) (Signed)
   01/17/2023  Name: Karen Hebert MRN: [id number] DOB: 1971-01-12  Today's TOC FU Call Status: Today's TOC FU Call Status:: Unsuccessul Call (1st Attempt) Unsuccessful Call (1st Attempt) Date: 01/17/23  Attempted to reach the patient regarding the most recent Inpatient/ED visit.  Follow Up Plan: Additional outreach attempts will be made to reach the patient to complete the Transitions of Care (Post Inpatient/ED visit) call.   Signature Delphia Kaylor RMA

## 2023-01-18 ENCOUNTER — Telehealth: Payer: Self-pay

## 2023-01-18 LAB — HEPATITIS PANEL, ACUTE: Hep A IgM: NEGATIVE — AB

## 2023-01-18 NOTE — Transitions of Care (Post Inpatient/ED Visit) (Signed)
   01/18/2023  Name: Karen Hebert MRN: [id number] DOB: 1970/11/21  Today's TOC FU Call Status: Today's TOC FU Call Status:: Unsuccessful Call (2nd Attempt) Unsuccessful Call (2nd Attempt) Date: 01/18/23  Attempted to reach the patient regarding the most recent Inpatient/ED visit.  Follow Up Plan: Additional outreach attempts will be made to reach the patient to complete the Transitions of Care (Post Inpatient/ED visit) call.   Signature Boluwatife Mutchler RMA

## 2023-01-19 ENCOUNTER — Telehealth: Payer: Self-pay

## 2023-01-19 NOTE — Transitions of Care (Post Inpatient/ED Visit) (Signed)
   01/19/2023  Name: Karen Hebert MRN: [id number] DOB: 1970-11-01  Today's TOC FU Call Status: Today's TOC FU Call Status:: Unsuccessful Call (3rd Attempt) Unsuccessful Call (3rd Attempt) Date: 01/19/23  Attempted to reach the patient regarding the most recent Inpatient/ED visit.  Follow Up Plan: No further outreach attempts will be made at this time. We have been unable to contact the patient.  Signature Evadean Sproule RMA

## 2023-01-25 ENCOUNTER — Telehealth: Payer: Self-pay

## 2023-01-25 NOTE — Telephone Encounter (Signed)
Pt. Called LM stating she was recently in the hospital due to constipation due to her Ozempic. The hospital doctor and GI told her to stop taking Ozempic but they wanted you to change it to something different.

## 2023-01-26 ENCOUNTER — Ambulatory Visit: Payer: Managed Care, Other (non HMO) | Admitting: Gastroenterology

## 2023-01-27 ENCOUNTER — Encounter: Payer: Self-pay | Admitting: Gastroenterology

## 2023-01-27 DIAGNOSIS — E669 Obesity, unspecified: Secondary | ICD-10-CM

## 2023-01-27 DIAGNOSIS — I10 Essential (primary) hypertension: Secondary | ICD-10-CM

## 2023-01-27 DIAGNOSIS — E1165 Type 2 diabetes mellitus with hyperglycemia: Secondary | ICD-10-CM

## 2023-01-27 DIAGNOSIS — E1169 Type 2 diabetes mellitus with other specified complication: Secondary | ICD-10-CM

## 2023-02-01 ENCOUNTER — Encounter: Payer: Self-pay | Admitting: Nurse Practitioner

## 2023-02-01 ENCOUNTER — Telehealth (INDEPENDENT_AMBULATORY_CARE_PROVIDER_SITE_OTHER): Payer: Managed Care, Other (non HMO) | Admitting: Nurse Practitioner

## 2023-02-01 DIAGNOSIS — R0789 Other chest pain: Secondary | ICD-10-CM

## 2023-02-01 DIAGNOSIS — E1159 Type 2 diabetes mellitus with other circulatory complications: Secondary | ICD-10-CM | POA: Diagnosis not present

## 2023-02-01 DIAGNOSIS — Z7985 Long-term (current) use of injectable non-insulin antidiabetic drugs: Secondary | ICD-10-CM

## 2023-02-01 DIAGNOSIS — R002 Palpitations: Secondary | ICD-10-CM

## 2023-02-01 DIAGNOSIS — E1165 Type 2 diabetes mellitus with hyperglycemia: Secondary | ICD-10-CM | POA: Diagnosis not present

## 2023-02-01 DIAGNOSIS — E782 Mixed hyperlipidemia: Secondary | ICD-10-CM | POA: Diagnosis not present

## 2023-02-01 DIAGNOSIS — R635 Abnormal weight gain: Secondary | ICD-10-CM

## 2023-02-01 DIAGNOSIS — E1169 Type 2 diabetes mellitus with other specified complication: Secondary | ICD-10-CM

## 2023-02-01 DIAGNOSIS — E669 Obesity, unspecified: Secondary | ICD-10-CM

## 2023-02-01 DIAGNOSIS — I152 Hypertension secondary to endocrine disorders: Secondary | ICD-10-CM

## 2023-02-01 DIAGNOSIS — R7401 Elevation of levels of liver transaminase levels: Secondary | ICD-10-CM

## 2023-02-01 NOTE — Patient Instructions (Addendum)
Continue on your current dose of Ozempic with the fiber. If you go more than usual without a BM, let me know.   I am going to hold off on the cholesterol medication until we check your liver function again. We can do that in about 4-6 weeks.   I sent the referral to cardiology for you.

## 2023-02-01 NOTE — Progress Notes (Signed)
Virtual Visit Encounter mychart visit.   I connected with  Karen Hebert on 03/10/23 at  1:30 PM EDT by secure video and audio telemedicine application. I verified that I am speaking with the correct person using two identifiers.   I introduced myself as a Publishing rights manager with the practice. The limitations of evaluation and management by telemedicine discussed with the patient and the availability of in person appointments. The patient expressed verbal understanding and consent to proceed.  Participating parties in this visit include: Myself and patient  The patient is: Patient Location: Home I am: Provider Location: Office/Clinic Subjective:    CC and HPI: Karen Hebert is a 52 y.o. year old female presenting for follow up of Diabetes associated with HTN, HLD, and Obesity and recent hospitalization. Patient reports the following:  Karen Hebert presents today with concerns related to a recent hospitalization for ileus believed to be exacerbated by an increase in her Ozempic dosage, a medication she has been taking for diabetes. She mentions that this issue has been a lifelong problem, and the increased dosage led to severe complications. Her liver enzymes elevated significantly during this episode but have since improved. She tells me the hospitalist encouraged her to stop the medication, but she expresses a strong desire to continue at the lower dosage, highlighting that she was stable on that dose for quite a while without problems and her blood sugar is very controlled.   She also discusses her decision to not pick up a new prescription for Linzess, opting instead to manage her condition with dietary fiber. She currently weighs 225 pounds and expresses concern about the impact of her weight on her health, particularly her diabetes, which she notes is otherwise well-managed with Ozempic. She has concerns stopping the medication will also cause more weight gain.  Additionally, Karen Hebert  describes experiencing significant cardiac symptoms, such as an erratic and rapid heartbeat during physical exertion or when waking up suddenly. These symptoms have been ongoing for several months and are concerning enough that she monitors them with a watch. She recalls an episode that led her to the ER, where she was told she likely had Bell's palsy, though she suspected a mini-stroke due to facial drooping and speech changes.  Currently, Karen Hebert is not on any medications except for Ozempic, which she continues to take at a dosage of two milligrams. She supplements this with Metamucil gummies and probiotics to manage her gastrointestinal symptoms. She mentions that her prescription for cholesterol medication has expired.  Past medical history, Surgical history, Family history not pertinant except as noted below, Social history, Allergies, and medications have been entered into the medical record, reviewed, and corrections made.   Review of Systems:  All review of systems negative except what is listed in the HPI  Objective:    Alert and oriented x 4 Speaking in clear sentences with no shortness of breath. No distress.  Impression and Recommendations:    Problem List Items Addressed This Visit     Type 2 diabetes mellitus with hyperglycemia, without long-term current use of insulin (HCC) (Chronic)    Patient reports hospitalization due to suspected Ozempic-induced gastrointestinal issues. She would like to continue the medication, despite concerns for possible return of ileus. Patient reports weight gain and concerns regarding diabetes management without Ozempic.  Plan: - Continue with Ozempic and monitor for daily bowel movement.  -Continue with increased fiber and water, as well as daily walking  - If bowel movement frequency decreases, stop Ozempic and  notify the provider.      Hypertension associated with diabetes (HCC) - Primary    History of HTN in the setting of DM and obesity with  increased CVD risks. Her BP appears stable at this time. She is only on amlodipine 5mg  and tolerating well. I do feel that her use of GLP-1 is beneficial to reduction of this risk. We discussed alternative options.  Plan: - Continue amlodipine - Use caution with continuation of ozempic and discontinue immediately if symptoms of constipation arise.       Weight gain    Previously losing weight while on GLP-1. Unfortunately, since stopping she had noticed weight gain and restarted despite concerns that this contributed to the ileus. She is eager to lose weight and control her diabetes and risks and understands that continuing this medication may lead to another ileus. She requests another try on the medication.  Plan: - Cautiously continue with Ozempic- I do recommend dropping to a lower dose.  - Start linzess to help keep bowels moving - If you have missed a bowel movement for 2 days, proceed with a bowel cleanout.       Obesity (BMI 30-39.9)    Previously losing weight while on GLP-1. Unfortunately, since stopping she had noticed weight gain and restarted despite concerns that this contributed to the ileus. She is eager to lose weight and control her diabetes and risks and understands that continuing this medication may lead to another ileus. She requests another try on the medication.  Plan: - Cautiously continue with Ozempic- I do recommend dropping to a lower dose.  - Start linzess to help keep bowels moving - If you have missed a bowel movement for 2 days, proceed with a bowel cleanout.       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 Will continue to monitor. Will hold off for a few weeks then consider restart of lipid lowering medication      Other Visit Diagnoses     Mixed hyperlipidemia       Rapid palpitations        Relevant Orders   Ambulatory referral to Cardiology       orders and follow up as documented in EMR I discussed the assessment and treatment plan with the patient. The patient was provided an opportunity to ask questions and all were answered. The patient agreed with the plan and demonstrated an understanding of the instructions.   The patient was advised to call back or seek an in-person evaluation if the symptoms worsen or if the condition fails to improve as anticipated.  Follow-Up: in 3 months  I provided 28 minutes of non-face-to-face interaction with this non face-to-face encounter including intake, same-day documentation, and chart review.   Tollie Eth, NP , DNP, AGNP-c [facility]

## 2023-02-10 ENCOUNTER — Telehealth: Payer: Self-pay

## 2023-02-10 NOTE — Telephone Encounter (Signed)
Pt. Called stating she was told to call back if she was having any more constipation issues with lowering the dosage of ozempic. She said she is still having constipation. She has not had a bowel movement in 3 days. She is taking Miralax twice a day and a probiotic. She wanted some further recommendations for the constipation because last time she had to end up going to the hospital.

## 2023-02-17 ENCOUNTER — Ambulatory Visit: Payer: Managed Care, Other (non HMO) | Admitting: Internal Medicine

## 2023-02-17 ENCOUNTER — Telehealth: Payer: Self-pay | Admitting: Internal Medicine

## 2023-02-17 ENCOUNTER — Ambulatory Visit: Payer: Managed Care, Other (non HMO) | Attending: Internal Medicine | Admitting: Internal Medicine

## 2023-02-17 ENCOUNTER — Encounter: Payer: Self-pay | Admitting: Internal Medicine

## 2023-02-17 VITALS — BP 144/102 | HR 73 | Ht 69.0 in | Wt 221.4 lb

## 2023-02-17 DIAGNOSIS — R0789 Other chest pain: Secondary | ICD-10-CM | POA: Diagnosis not present

## 2023-02-17 DIAGNOSIS — G4733 Obstructive sleep apnea (adult) (pediatric): Secondary | ICD-10-CM

## 2023-02-17 DIAGNOSIS — R002 Palpitations: Secondary | ICD-10-CM

## 2023-02-17 HISTORY — DX: Other chest pain: R07.89

## 2023-02-17 MED ORDER — AMLODIPINE BESYLATE 5 MG PO TABS: 5.0000 mg | ORAL_TABLET | Freq: Every day | ORAL | 1 refills | Status: DC

## 2023-02-17 NOTE — Patient Instructions (Addendum)
Medication Instructions:  Your physician has recommended you make the following change in your medication:  Start taking Amlodipine 5 mg once a day Continue taking all other medications as prescribed  Labwork: None  Testing/Procedures: Your physician has requested that you have an echocardiogram. Echocardiography is a painless test that uses sound waves to create images of your heart. It provides your doctor with information about the size and shape of your heart and how well your heart's chambers and valves are working. This procedure takes approximately one hour. There are no restrictions for this procedure. Please do NOT wear cologne, perfume, aftershave, or lotions (deodorant is allowed). Please arrive 15 minutes prior to your appointment time.   Follow-Up: Your physician recommends that you schedule a follow-up appointment in: 6 months  Any Other Special Instructions Will Be Listed Below (If Applicable).  Referred to Pulmonology   If you need a refill on your cardiac medications before your next appointment, please call your pharmacy.

## 2023-02-17 NOTE — Telephone Encounter (Signed)
Checking percert on the following patient for testing scheduled at [facility].   ECHO - 03-28-2023

## 2023-02-17 NOTE — Progress Notes (Signed)
Cardiology Office Note  Date: 02/17/2023   ID: Karen Hebert, DOB 04-29-1971, MRN [id number]  PCP:  Tollie Eth, NP  Cardiologist:  None Electrophysiologist:  None   Reason for Office Visit: Evaluation of chest pressure at the request of Early, NP   History of Present Illness: Karen Hebert is a 52 y.o. female known to have HTN, DM 2 not on medications was referred to cardiology clinic for evaluation of chest pressure.  Patient has substernal chest pressure x couple of months, occurs with exertion, last for 10 minutes and resolves with rest.  Frequency few times per week.  He was diagnosed with HTN at a dentist office a few years ago when her BP was noted to be >200 mm Hg SBP. She was originally on antihypertensive medications but she was taken off of them recently due to elevated liver enzymes. Last night, she checked her BP and was noted to be 186/110 mm Hg.  Currently not on any antihypertensive medications.  She was admitted recently in 5/24 with severe constipation secondary to Ozempic use and was told to stop Ozempic.  Currently on Ozempic.  After increasing fiber intake and p.o. fluid intake, constipation seem to resolve after discharge from hospital.  Denies other symptoms of DOE, orthopnea, PND, dizziness, lightheadedness, syncope and leg swelling.  Patient snores at night and was told she stops breathing in her sleep.  Was never tested for OSA in the past.  Patient is adopted and does not know the family history.   Past Medical History:  Diagnosis Date   Acute left flank pain 01/06/2023   Diabetes mellitus without complication (HCC)    Encounter to establish care 12/25/2020   Hypertension    Laboratory tests ordered as part of a complete physical exam (CPE) 12/25/2020    Past Surgical History:  Procedure Laterality Date   APPENDECTOMY     CHOLECYSTECTOMY      Current Outpatient Medications  Medication Sig Dispense Refill   cholecalciferol (VITAMIN D3)  25 MCG (1000 UNIT) tablet Take 1,000 Units by mouth daily.     Multiple Vitamin (MULTIVITAMIN WITH MINERALS) TABS tablet Take 1 tablet by mouth daily.     ondansetron (ZOFRAN) 8 MG tablet Take 1 tablet (8 mg total) by mouth every 8 (eight) hours as needed for nausea or vomiting. Take 1 tab (8mg ) every 8 hours as needed for nausea. 30 tablet 2   Semaglutide, 2 MG/DOSE, (OZEMPIC, 2 MG/DOSE,) 8 MG/3ML SOPN      No current facility-administered medications for this visit.   Allergies:  Penicillins   Social History: The patient  reports that she has never smoked. She has never used smokeless tobacco. She reports that she does not currently use alcohol. She reports that she does not currently use drugs.   Family History: The patient's She was adopted. Family history is unknown by patient.   ROS:  Please see the history of present illness. Otherwise, complete review of systems is positive for none  All other systems are reviewed and negative.   Physical Exam: VS:  BP (!) 144/102   Pulse 73   Ht 5\' 9"  (1.753 m)   Wt 221 lb 6.4 oz (100.4 kg)   SpO2 96%   BMI 32.70 kg/m , BMI Body mass index is 32.7 kg/m.  Wt Readings from Last 3 Encounters:  02/17/23 221 lb 6.4 oz (100.4 kg)  01/13/23 219 lb 12.8 oz (99.7 kg)  12/14/22 221 lb (100.2 kg)  General: Patient appears comfortable at rest. HEENT: Conjunctiva and lids normal, oropharynx clear with moist mucosa. Neck: Supple, no elevated JVP or carotid bruits, no thyromegaly. Lungs: Clear to auscultation, nonlabored breathing at rest. Cardiac: Regular rate and rhythm, no S3 or significant systolic murmur, no pericardial rub. Abdomen: Soft, nontender, no hepatomegaly, bowel sounds present, no guarding or rebound. Extremities: No pitting edema, distal pulses 2+. Skin: Warm and dry. Musculoskeletal: No kyphosis. Neuropsychiatric: Alert and oriented x3, affect grossly appropriate.  Recent Labwork: 01/13/2023: ALT 217; AST 283; BUN 13;  Creatinine, Ser 0.76; Hemoglobin 12.5; Magnesium 2.5; Platelets 252; Potassium 3.4; Sodium 137     Component Value Date/Time   CHOL 196 12/14/2022 0831   TRIG 163 (H) 12/14/2022 0831   HDL 60 12/14/2022 0831   CHOLHDL 3.3 12/14/2022 0831   CHOLHDL 3.7 01/27/2021 1635   VLDL 71 (H) 01/27/2021 1635   LDLCALC 108 (H) 12/14/2022 0831    Other Studies Reviewed Today:   Assessment and Plan:  # Chest pressure likely secondary to poorly controlled HTN # Poorly controlled HTN -Ongoing chest pressure with exertion x couple of months, lasting 10 minutes, frequency few times per week and resolves with rest.  Off antihypertensive medications after 5/24 hospitalization due to elevated liver enzymes. BP last night was 186/110 mmHg.  Will start her on amlodipine 5 mg once daily and reassess her chest pressure symptoms in 6 months.  I believe her chest pressure is likely secondary to poorly controlled HTN. I do not think it safe to perform a stress test to evaluate for ischemia in the setting of resting elevated blood pressures. Obtain 2D echocardiogram.  # Type 2 diabetes mellitus -Follows up with PCP, controlled with diet.  On Ozempic.   I have spent a total of 45 minutes with patient reviewing chart, EKGs, labs and examining patient as well as establishing an assessment and plan that was discussed with the patient.  > 50% of time was spent in direct patient care.    Medication Adjustments/Labs and Tests Ordered: Current medicines are reviewed at length with the patient today.  Concerns regarding medicines are outlined above.   Tests Ordered: Orders Placed This Encounter  Procedures   EKG 12-Lead    Medication Changes: No orders of the defined types were placed in this encounter.   Disposition:  Follow up  6 months  Signed Estell Dillinger Verne Spurr, MD, 02/17/2023 9:58 AM    [facility] [address] [place], [place], [place] [address]

## 2023-02-22 MED ORDER — TIRZEPATIDE 2.5 MG/0.5ML ~~LOC~~ SOAJ
2.5000 mg | SUBCUTANEOUS | 0 refills | Status: DC
Start: 2023-02-22 — End: 2023-02-23

## 2023-02-22 NOTE — Addendum Note (Signed)
Addended by: Sidnie Swalley, Huntley Dec E on: 02/22/2023 08:45 PM   Modules accepted: Orders

## 2023-02-23 ENCOUNTER — Other Ambulatory Visit: Payer: Self-pay

## 2023-02-23 DIAGNOSIS — I10 Essential (primary) hypertension: Secondary | ICD-10-CM

## 2023-02-23 DIAGNOSIS — E1169 Type 2 diabetes mellitus with other specified complication: Secondary | ICD-10-CM

## 2023-02-23 DIAGNOSIS — E1165 Type 2 diabetes mellitus with hyperglycemia: Secondary | ICD-10-CM

## 2023-02-23 DIAGNOSIS — E669 Obesity, unspecified: Secondary | ICD-10-CM

## 2023-02-23 MED ORDER — TIRZEPATIDE 2.5 MG/0.5ML ~~LOC~~ SOAJ
2.5000 mg | SUBCUTANEOUS | 0 refills | Status: DC
Start: 2023-02-23 — End: 2023-05-06

## 2023-02-23 MED ORDER — AMLODIPINE BESYLATE 5 MG PO TABS: 5.0000 mg | ORAL_TABLET | Freq: Every day | ORAL | 3 refills | Status: DC

## 2023-03-10 ENCOUNTER — Other Ambulatory Visit: Payer: Managed Care, Other (non HMO)

## 2023-03-10 ENCOUNTER — Ambulatory Visit: Payer: Managed Care, Other (non HMO) | Admitting: Internal Medicine

## 2023-03-10 ENCOUNTER — Other Ambulatory Visit: Payer: Self-pay

## 2023-03-10 ENCOUNTER — Encounter: Payer: Self-pay | Admitting: Nurse Practitioner

## 2023-03-10 DIAGNOSIS — R7989 Other specified abnormal findings of blood chemistry: Secondary | ICD-10-CM

## 2023-03-10 NOTE — Assessment & Plan Note (Signed)
Stable no change

## 2023-03-10 NOTE — Assessment & Plan Note (Signed)
History of palpitations and pressure in the chest with no clear etiology. Given her hypertension, diabetes, and hyperlipidemia, she is considered at increased risk for CVD.  Plan: - Referral to cardiology for evaluation.

## 2023-03-10 NOTE — Assessment & Plan Note (Signed)
Previously losing weight while on GLP-1. Unfortunately, since stopping she had noticed weight gain and restarted despite concerns that this contributed to the ileus. She is eager to lose weight and control her diabetes and risks and understands that continuing this medication may lead to another ileus. She requests another try on the medication.  Plan: - Cautiously continue with Ozempic- I do recommend dropping to a lower dose.  - Start linzess to help keep bowels moving - If you have missed a bowel movement for 2 days, proceed with a bowel cleanout.  

## 2023-03-10 NOTE — Assessment & Plan Note (Addendum)
Patient reports hospitalization due to suspected Ozempic-induced gastrointestinal issues. She would like to continue the medication, despite concerns for possible return of ileus. Patient reports weight gain and concerns regarding diabetes management without Ozempic.  Plan: - Continue with Ozempic and monitor for daily bowel movement.  -Continue with increased fiber and water, as well as daily walking  - If bowel movement frequency decreases, stop Ozempic and notify the provider.

## 2023-03-10 NOTE — Assessment & Plan Note (Signed)
History of HTN in the setting of DM and obesity with increased CVD risks. Her BP appears stable at this time. She is only on amlodipine 5mg  and tolerating well. I do feel that her use of GLP-1 is beneficial to reduction of this risk. We discussed alternative options.  Plan: - Continue amlodipine - Use caution with continuation of ozempic and discontinue immediately if symptoms of constipation arise.

## 2023-03-10 NOTE — Assessment & Plan Note (Addendum)
>>  ASSESSMENT AND PLAN FOR OBESITY (BMI 30-39.9) WRITTEN ON 03/10/2023  2:45 PM BY Lakoda Mcanany E, NP  Previously losing weight while on GLP-1. Unfortunately, since stopping she had noticed weight gain and restarted despite concerns that this contributed to the ileus. She is eager to lose weight and control her diabetes and risks and understands that continuing this medication may lead to another ileus. She requests another try on the medication.  Plan: - Cautiously continue with Ozempic - I do recommend dropping to a lower dose.  - Start linzess  to help keep bowels moving - If you have missed a bowel movement for 2 days, proceed with a bowel cleanout.    >>ASSESSMENT AND PLAN FOR WEIGHT GAIN WRITTEN ON 03/10/2023  2:44 PM BY Lamoine Magallon E, NP  Previously losing weight while on GLP-1. Unfortunately, since stopping she had noticed weight gain and restarted despite concerns that this contributed to the ileus. She is eager to lose weight and control her diabetes and risks and understands that continuing this medication may lead to another ileus. She requests another try on the medication.  Plan: - Cautiously continue with Ozempic - I do recommend dropping to a lower dose.  - Start linzess  to help keep bowels moving - If you have missed a bowel movement for 2 days, proceed with a bowel cleanout.

## 2023-03-10 NOTE — Assessment & Plan Note (Signed)
Resolved following hospitalization. Will continue to monitor. Will hold off for a few weeks then consider restart of lipid lowering medication

## 2023-03-28 ENCOUNTER — Ambulatory Visit (HOSPITAL_COMMUNITY): Admission: RE | Admit: 2023-03-28 | Payer: Managed Care, Other (non HMO) | Source: Ambulatory Visit

## 2023-04-14 ENCOUNTER — Ambulatory Visit: Payer: Managed Care, Other (non HMO) | Admitting: Internal Medicine

## 2023-05-05 ENCOUNTER — Encounter: Payer: Self-pay | Admitting: Pulmonary Disease

## 2023-05-05 ENCOUNTER — Ambulatory Visit: Payer: Managed Care, Other (non HMO) | Admitting: Pulmonary Disease

## 2023-05-05 ENCOUNTER — Other Ambulatory Visit: Payer: Self-pay | Admitting: Nurse Practitioner

## 2023-05-05 VITALS — BP 136/92 | HR 63 | Ht 69.0 in | Wt 221.0 lb

## 2023-05-05 DIAGNOSIS — G4733 Obstructive sleep apnea (adult) (pediatric): Secondary | ICD-10-CM | POA: Diagnosis not present

## 2023-05-05 DIAGNOSIS — E1159 Type 2 diabetes mellitus with other circulatory complications: Secondary | ICD-10-CM | POA: Diagnosis not present

## 2023-05-05 DIAGNOSIS — E1165 Type 2 diabetes mellitus with hyperglycemia: Secondary | ICD-10-CM

## 2023-05-05 DIAGNOSIS — I10 Essential (primary) hypertension: Secondary | ICD-10-CM

## 2023-05-05 DIAGNOSIS — I152 Hypertension secondary to endocrine disorders: Secondary | ICD-10-CM

## 2023-05-05 DIAGNOSIS — E782 Mixed hyperlipidemia: Secondary | ICD-10-CM

## 2023-05-05 DIAGNOSIS — E669 Obesity, unspecified: Secondary | ICD-10-CM

## 2023-05-05 NOTE — Patient Instructions (Signed)
X home sleep test °

## 2023-05-05 NOTE — Assessment & Plan Note (Signed)
Remains uncontrolled on 5 mg of amlodipine advised her to increase to 10 and consult with PCP

## 2023-05-05 NOTE — Progress Notes (Signed)
Subjective:    Patient ID: Karen Hebert, female    DOB: December 04, 1970, 52 y.o.   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  She reports episodes of suddenly waking up from sleep with irregular palpitations.  This can happen during naps or nocturnal sleep, does not happen when she is awake.  It only happens if she sits up in bed or gets up quickly from a supine position.  Frequency is about once a month. She was evaluated by cardiology for substernal chest pressure and this was felt to be due to poorly controlled blood pressure.  Stress test was deferred Epworth sleepiness score is 13 and she reports sleepiness while lying down to rest in the afternoons or sitting quietly after lunch.  She is exhausted after driving back home from work about a 1 hour drive.  Occasionally needs some naps.  Naps on weekends are refreshing. Bedtime is around 10:30 PM, sleep latency about 20 minutes, she sleeps on her side with 1 pillow and moves around in bed, she wakes up at least 3 times including nocturia and reports increase post void sleep latency.  She is out of bed at 5 AM feeling tired with dryness of mouth There is no history suggestive of cataplexy, sleep paralysis or parasomnias    Past Medical History:  Diagnosis Date   Acute left flank pain 01/06/2023   Diabetes mellitus without complication (HCC)    Encounter to establish care 12/25/2020   Fluid retention in tissues 12/25/2020   Hypertension    Laboratory tests ordered as part of a complete physical exam (CPE) 12/25/2020    Past Surgical History:  Procedure Laterality Date   APPENDECTOMY     CHOLECYSTECTOMY      Allergies   Allergen Reactions   Penicillins Anaphylaxis    Social History   Socioeconomic History   Marital status: Married    Spouse name: Not on file   Number of children: Not on file   Years of education: Not on file   Highest education level: Not on file  Occupational History   Not on file  Tobacco Use   Smoking status: Never   Smokeless tobacco: Never  Vaping Use   Vaping status: Never Used  Substance and Sexual Activity   Alcohol use: Not Currently   Drug use: Not Currently   Sexual activity: Not on file  Other Topics Concern   Not on file  Social History Narrative   Not on file   Social Determinants of Health   Financial Resource Strain: Not on file  Food Insecurity: No Food Insecurity (01/13/2023)   Hunger Vital Sign    Worried About Running Out of Food in the Last Year: Never true    Ran Out of Food in the Last Year: Never true  Transportation Needs: No Transportation Needs (01/13/2023)   PRAPARE - Administrator, Civil Service (Medical): No    Lack of Transportation (Non-Medical): No  Physical Activity: Not on file  Stress: Not on file  Social Connections: Not on file  Intimate Partner Violence: Not At Risk (01/13/2023)   Humiliation, Afraid, Rape, and Kick questionnaire  Fear of Current or Ex-Partner: No    Emotionally Abused: No    Physically Abused: No    Sexually Abused: No    Family History  Adopted: Yes  Family history unknown: Yes       Review of Systems Constitutional: negative for anorexia, fevers and sweats  Eyes: negative for irritation, redness and visual disturbance  Ears, nose, mouth, throat, and face: negative for earaches, epistaxis, nasal congestion and sore throat  Respiratory: negative for cough, dyspnea on exertion, sputum and wheezing  Cardiovascular: negative for chest pain, dyspnea, lower extremity edema, orthopnea, palpitations and syncope  Gastrointestinal: negative for abdominal pain, constipation, diarrhea, melena,  nausea and vomiting  Genitourinary:negative for dysuria, frequency and hematuria  Hematologic/lymphatic: negative for bleeding, easy bruising and lymphadenopathy  Musculoskeletal:negative for arthralgias, muscle weakness and stiff joints  Neurological: negative for coordination problems, gait problems, headaches and weakness  Endocrine: negative for diabetic symptoms including polydipsia, polyuria and weight loss     Objective:   Physical Exam  Gen. Pleasant, obese, in no distress, normal affect ENT - no pallor,icterus, no post nasal drip, class 2-3 airway Neck: No JVD, no thyromegaly, no carotid bruits Lungs: no use of accessory muscles, no dullness to percussion, decreased without rales or rhonchi  Cardiovascular: Rhythm regular, heart sounds  normal, no murmurs or gallops, no peripheral edema Abdomen: soft and non-tender, no hepatosplenomegaly, BS normal. Musculoskeletal: No deformities, no cyanosis or clubbing Neuro:  alert, non focal, no tremors        Assessment & Plan:    OSA - Given excessive daytime fatigue, uncontrolled BP, narrow pharyngeal exam, & loud snoring, obstructive sleep apnea is possible & an overnight polysomnogram will be scheduled as a homestudy. The pathophysiology of obstructive sleep apnea , it's cardiovascular consequences & modes of treatment including CPAP were discused with the patient in detail & they evidenced understanding.  She already uses a mouthguard for teeth grinding and if OSA is mild we can consider refashioning this into her dental appliance.  If AHI is more than 15/h we will recommend CPAP therapy

## 2023-05-06 ENCOUNTER — Telehealth (INDEPENDENT_AMBULATORY_CARE_PROVIDER_SITE_OTHER): Payer: Managed Care, Other (non HMO) | Admitting: Nurse Practitioner

## 2023-05-06 ENCOUNTER — Encounter: Payer: Self-pay | Admitting: Nurse Practitioner

## 2023-05-06 VITALS — BP 159/100 | Wt 221.0 lb

## 2023-05-06 DIAGNOSIS — Z7985 Long-term (current) use of injectable non-insulin antidiabetic drugs: Secondary | ICD-10-CM

## 2023-05-06 DIAGNOSIS — E1165 Type 2 diabetes mellitus with hyperglycemia: Secondary | ICD-10-CM | POA: Diagnosis not present

## 2023-05-06 DIAGNOSIS — I152 Hypertension secondary to endocrine disorders: Secondary | ICD-10-CM | POA: Diagnosis not present

## 2023-05-06 DIAGNOSIS — T753XXA Motion sickness, initial encounter: Secondary | ICD-10-CM

## 2023-05-06 DIAGNOSIS — E1169 Type 2 diabetes mellitus with other specified complication: Secondary | ICD-10-CM | POA: Diagnosis not present

## 2023-05-06 DIAGNOSIS — E669 Obesity, unspecified: Secondary | ICD-10-CM

## 2023-05-06 DIAGNOSIS — E1159 Type 2 diabetes mellitus with other circulatory complications: Secondary | ICD-10-CM | POA: Diagnosis not present

## 2023-05-06 DIAGNOSIS — E782 Mixed hyperlipidemia: Secondary | ICD-10-CM

## 2023-05-06 MED ORDER — AMLODIPINE-VALSARTAN-HCTZ 5-160-12.5 MG PO TABS
ORAL_TABLET | ORAL | 1 refills | Status: DC
Start: 2023-05-06 — End: 2023-12-06

## 2023-05-06 MED ORDER — TIRZEPATIDE 7.5 MG/0.5ML ~~LOC~~ SOAJ
7.5000 mg | SUBCUTANEOUS | 0 refills | Status: DC
Start: 2023-05-06 — End: 2023-05-09

## 2023-05-06 MED ORDER — MOUNJARO 10 MG/0.5ML ~~LOC~~ SOAJ
10.0000 mg | SUBCUTANEOUS | 1 refills | Status: DC
Start: 2023-05-06 — End: 2023-05-09

## 2023-05-06 MED ORDER — SCOPOLAMINE 1 MG/3DAYS TD PT72
1.0000 | MEDICATED_PATCH | TRANSDERMAL | 1 refills | Status: DC
Start: 2023-05-06 — End: 2023-05-09

## 2023-05-06 MED ORDER — TIRZEPATIDE 5 MG/0.5ML ~~LOC~~ SOAJ: 5.0000 mg | SUBCUTANEOUS | 0 refills | Status: DC

## 2023-05-06 NOTE — Assessment & Plan Note (Signed)
Her BP remains uncontrolled. It appears the amlodipine alone is not sufficient to help with management. We will plan to change the medication to amlodipine-valsartan-hydrochlorothiazide combo and start with 1 pill a day to see how her blood pressure responds. If she has continued elevation above 140/80, I recommend increasing to 2 tabs a day. We will make changes as necessary based on the results.

## 2023-05-06 NOTE — Progress Notes (Signed)
Virtual Visit Encounter mychart visit.   I connected with  Karen Hebert on 05/06/23 at  2:00 PM EDT by secure video and audio telemedicine application. I verified that I am speaking with the correct person using two identifiers.   I introduced myself as a Publishing rights manager with the practice. The limitations of evaluation and management by telemedicine discussed with the patient and the availability of in person appointments. The patient expressed verbal understanding and consent to proceed.  Participating parties in this visit include: Myself and patient  The patient is: Patient Location: Home I am: Provider Location: Office/Clinic Subjective:    CC and HPI: Karen Hebert is a 52 y.o. year old female presenting for follow up of DM. Patient reports the following:  [name] tells me since starting [facility] she is feeling "so good". Her blood sugars are becoming better controlled and she is no longer having the sickness she experienced with Ozempic. She is not having any GI issues at this time with the current medication. She feels that she is ready to increase to the next dose. She is eating smaller portions and denies any symptoms of hypoglycemia.   Her blood pressures remain elevated at this time. She tells me she has been working to get these down, but they continue to stay in the 150 range. She is taking her amlodipine as prescribed. The dose was recently increased to 10mg  by Dr. Vassie Loll.   Past medical history, Surgical history, Family history not pertinant except as noted below, Social history, Allergies, and medications have been entered into the medical record, reviewed, and corrections made.   Review of Systems:  All review of systems negative except what is listed in the HPI  Objective:    Alert and oriented x 4 Speaking in clear sentences with no shortness of breath. No distress.  Impression and Recommendations:    Problem List Items Addressed This Visit     Type 2  diabetes mellitus with hyperglycemia, without long-term current use of insulin (HCC) - Primary (Chronic)    Mounjaro 2.5mg  dose has been excellent for her and she is ready to increase to the 5mg  dose. We are tapering her doses slowly due to the GI issues she has had in the past with the Ozempic. I feel that at this time her tolerance is good enough to consider a three month supply to include the next 3 doses. If she is having issues with abdominal discomfort or constipation, however, she will let me know and we will change the dosing back to three months on the same dose before titration. This will take longer for glycemic control, but this is ok. We will monitor again in about 3 months.       Relevant Medications   tirzepatide (MOUNJARO) 5 MG/0.5ML Pen   tirzepatide (MOUNJARO) 7.5 MG/0.5ML Pen   tirzepatide (MOUNJARO) 10 MG/0.5ML Pen   amLODIPine-Valsartan-HCTZ 5-160-12.5 MG TABS   Hypertension associated with diabetes (HCC)    Her BP remains uncontrolled. It appears the amlodipine alone is not sufficient to help with management. We will plan to change the medication to amlodipine-valsartan-hydrochlorothiazide combo and start with 1 pill a day to see how her blood pressure responds. If she has continued elevation above 140/80, I recommend increasing to 2 tabs a day. We will make changes as necessary based on the results.       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 The patient was provided an opportunity to ask questions and all were answered. The patient agreed with the plan and demonstrated an understanding of the instructions.   The patient was advised to call back or seek an in-person evaluation if the symptoms worsen or if the condition fails to improve as anticipated.  Follow-Up: in 3 months  I provided 20 minutes of non-face-to-face interaction with this non face-to-face encounter including intake, same-day documentation, and chart review.   Tollie Eth, NP , DNP, AGNP-c  [facility]

## 2023-05-06 NOTE — Assessment & Plan Note (Signed)
Mounjaro 2.5mg  dose has been excellent for her and she is ready to increase to the 5mg  dose. We are tapering her doses slowly due to the GI issues she has had in the past with the Ozempic. I feel that at this time her tolerance is good enough to consider a three month supply to include the next 3 doses. If she is having issues with abdominal discomfort or constipation, however, she will let me know and we will change the dosing back to three months on the same dose before titration. This will take longer for glycemic control, but this is ok. We will monitor again in about 3 months.

## 2023-05-09 ENCOUNTER — Other Ambulatory Visit: Payer: Self-pay

## 2023-05-09 ENCOUNTER — Encounter: Payer: Self-pay | Admitting: Nurse Practitioner

## 2023-05-09 DIAGNOSIS — T753XXA Motion sickness, initial encounter: Secondary | ICD-10-CM

## 2023-05-09 DIAGNOSIS — E1165 Type 2 diabetes mellitus with hyperglycemia: Secondary | ICD-10-CM

## 2023-05-09 DIAGNOSIS — E782 Mixed hyperlipidemia: Secondary | ICD-10-CM

## 2023-05-09 DIAGNOSIS — E1159 Type 2 diabetes mellitus with other circulatory complications: Secondary | ICD-10-CM

## 2023-05-09 DIAGNOSIS — E669 Obesity, unspecified: Secondary | ICD-10-CM

## 2023-05-09 MED ORDER — TIRZEPATIDE 7.5 MG/0.5ML ~~LOC~~ SOAJ
7.5000 mg | SUBCUTANEOUS | 0 refills | Status: DC
Start: 2023-05-09 — End: 2023-12-06

## 2023-05-09 MED ORDER — MOUNJARO 10 MG/0.5ML ~~LOC~~ SOAJ
10.0000 mg | SUBCUTANEOUS | 1 refills | Status: DC
Start: 2023-05-09 — End: 2023-06-14

## 2023-05-09 MED ORDER — SCOPOLAMINE 1 MG/3DAYS TD PT72
1.0000 | MEDICATED_PATCH | TRANSDERMAL | 1 refills | Status: DC
Start: 2023-05-09 — End: 2023-12-06

## 2023-05-09 MED ORDER — TIRZEPATIDE 5 MG/0.5ML ~~LOC~~ SOAJ
5.0000 mg | SUBCUTANEOUS | 0 refills | Status: DC
Start: 2023-05-09 — End: 2024-01-04

## 2023-05-23 ENCOUNTER — Telehealth: Payer: Managed Care, Other (non HMO) | Admitting: Nurse Practitioner

## 2023-05-30 ENCOUNTER — Telehealth (INDEPENDENT_AMBULATORY_CARE_PROVIDER_SITE_OTHER): Payer: Managed Care, Other (non HMO) | Admitting: Pulmonary Disease

## 2023-05-30 DIAGNOSIS — G4733 Obstructive sleep apnea (adult) (pediatric): Secondary | ICD-10-CM

## 2023-05-30 NOTE — Telephone Encounter (Signed)
HST showed mild  OSA with AHI 9/ hr with low saturation of 76%  Options include dental appliance or CPAP therapy. Since she already uses a mouthguard, she can discuss with her dentist about refashioning this into an oral appliance for OSA.  If her dentist cannot do this we can refer her to a dentist in [place] -either Dr. Myrtis Ser or Dr. Toni Arthurs.  If she fails this or does not want to do this, then we can consider CPAP therapy  Routine follow-up with APP in 3 to 6 months

## 2023-05-30 NOTE — Telephone Encounter (Signed)
Tried to call patient her mailbox was full a couldn't lvm

## 2023-05-31 NOTE — Telephone Encounter (Signed)
Called and lvm for patient to call us back regarding her results

## 2023-06-02 DIAGNOSIS — G4733 Obstructive sleep apnea (adult) (pediatric): Secondary | ICD-10-CM

## 2023-06-02 NOTE — Telephone Encounter (Signed)
Patient wants to start CPAP. Not sure what settings you want them to use?

## 2023-06-02 NOTE — Telephone Encounter (Signed)
Patient would like to get a CPAP to be order for her.She will also make her follow up appointment closer to the time that it is needed.  Please call and advise.

## 2023-06-10 ENCOUNTER — Other Ambulatory Visit: Payer: Self-pay

## 2023-06-10 DIAGNOSIS — G4733 Obstructive sleep apnea (adult) (pediatric): Secondary | ICD-10-CM

## 2023-06-14 ENCOUNTER — Encounter: Payer: Self-pay | Admitting: Nurse Practitioner

## 2023-06-14 ENCOUNTER — Other Ambulatory Visit: Payer: Self-pay

## 2023-06-14 DIAGNOSIS — E1159 Type 2 diabetes mellitus with other circulatory complications: Secondary | ICD-10-CM

## 2023-06-14 DIAGNOSIS — E782 Mixed hyperlipidemia: Secondary | ICD-10-CM

## 2023-06-14 DIAGNOSIS — E669 Obesity, unspecified: Secondary | ICD-10-CM

## 2023-06-14 DIAGNOSIS — E1165 Type 2 diabetes mellitus with hyperglycemia: Secondary | ICD-10-CM

## 2023-06-14 MED ORDER — MOUNJARO 10 MG/0.5ML ~~LOC~~ SOAJ
10.0000 mg | SUBCUTANEOUS | 0 refills | Status: DC
Start: 2023-06-14 — End: 2023-09-28

## 2023-06-14 NOTE — Telephone Encounter (Signed)
PCC's- please review this email from pt about DME and her ins  I got a call today in reference to my cpap machine. He waned to discuss my rental agreement. We already discussed and I was told it was a purchase. That is not as expensive on my self insured plan. I feel this office really has no idea what is going on. You first sent my script to a non network provider, then said it was a purchase at [employer] in [place]. Now its a rental out of [place]. I would really appreciate someone to be aware of what they are saying BEFORE I get any respondse.

## 2023-07-08 ENCOUNTER — Other Ambulatory Visit: Payer: Self-pay

## 2023-07-08 MED ORDER — METFORMIN HCL ER 500 MG PO TB24: 500.0000 mg | ORAL_TABLET | Freq: Every day | ORAL | 1 refills | Status: DC

## 2023-07-20 ENCOUNTER — Other Ambulatory Visit: Payer: Self-pay

## 2023-07-20 ENCOUNTER — Telehealth: Payer: Self-pay | Admitting: Nurse Practitioner

## 2023-07-20 MED ORDER — METFORMIN HCL ER 500 MG PO TB24: 500.0000 mg | ORAL_TABLET | Freq: Every day | ORAL | 1 refills | Status: DC

## 2023-07-20 NOTE — Telephone Encounter (Signed)
Pt would like her metformin to be switched and sent through [facility] - [place], [place] - [address]

## 2023-08-15 ENCOUNTER — Encounter: Payer: Self-pay | Admitting: Internal Medicine

## 2023-08-15 ENCOUNTER — Encounter: Payer: Managed Care, Other (non HMO) | Admitting: Internal Medicine

## 2023-08-15 NOTE — Progress Notes (Signed)
Erroneous encounter - please disregard.

## 2023-09-28 ENCOUNTER — Other Ambulatory Visit: Payer: Self-pay | Admitting: Nurse Practitioner

## 2023-09-28 DIAGNOSIS — I152 Hypertension secondary to endocrine disorders: Secondary | ICD-10-CM

## 2023-09-28 DIAGNOSIS — E1165 Type 2 diabetes mellitus with hyperglycemia: Secondary | ICD-10-CM

## 2023-09-28 DIAGNOSIS — E1169 Type 2 diabetes mellitus with other specified complication: Secondary | ICD-10-CM

## 2023-09-28 DIAGNOSIS — E669 Obesity, unspecified: Secondary | ICD-10-CM

## 2023-11-25 ENCOUNTER — Emergency Department (HOSPITAL_COMMUNITY)
Admission: EM | Admit: 2023-11-25 | Discharge: 2023-11-26 | Disposition: A | Discharge status: Home / Self Care | Attending: Emergency Medicine | Admitting: Emergency Medicine

## 2023-11-25 ENCOUNTER — Encounter (HOSPITAL_COMMUNITY): Payer: Self-pay

## 2023-11-25 ENCOUNTER — Other Ambulatory Visit: Payer: Self-pay

## 2023-11-25 DIAGNOSIS — K921 Melena: Secondary | ICD-10-CM | POA: Diagnosis not present

## 2023-11-25 DIAGNOSIS — D649 Anemia, unspecified: Secondary | ICD-10-CM | POA: Diagnosis not present

## 2023-11-25 DIAGNOSIS — R1013 Epigastric pain: Secondary | ICD-10-CM

## 2023-11-25 DIAGNOSIS — D72829 Elevated white blood cell count, unspecified: Secondary | ICD-10-CM | POA: Diagnosis not present

## 2023-11-25 HISTORY — DX: Crohn's disease of large intestine without complications: K50.10

## 2023-11-25 NOTE — ED Triage Notes (Signed)
 Rcems from home. Cc of epigastric pain . Hx of crohns and blockages. N/v. And clear rectal discharge that started today.  143cbg with ems.  20g r ac.

## 2023-11-26 ENCOUNTER — Emergency Department (HOSPITAL_COMMUNITY)

## 2023-11-26 LAB — CBC WITH DIFFERENTIAL/PLATELET
Abs Immature Granulocytes: 0.15 10*3/uL — ABNORMAL HIGH (ref 0.00–0.07)
Basophils Absolute: 0.1 10*3/uL (ref 0.0–0.1)
Basophils Relative: 0 %
Eosinophils Absolute: 0.5 10*3/uL (ref 0.0–0.5)
Eosinophils Relative: 2 %
HCT: 34.9 % — ABNORMAL LOW (ref 36.0–46.0)
Hemoglobin: 10.9 g/dL — ABNORMAL LOW (ref 12.0–15.0)
Immature Granulocytes: 1 %
Lymphocytes Relative: 7 %
Lymphs Abs: 1.6 10*3/uL (ref 0.7–4.0)
MCH: 25.4 pg — ABNORMAL LOW (ref 26.0–34.0)
MCHC: 31.2 g/dL (ref 30.0–36.0)
MCV: 81.4 fL (ref 80.0–100.0)
Monocytes Absolute: 1.2 10*3/uL — ABNORMAL HIGH (ref 0.1–1.0)
Monocytes Relative: 5 %
Neutro Abs: 19.6 10*3/uL — ABNORMAL HIGH (ref 1.7–7.7)
Neutrophils Relative %: 85 %
Platelets: 371 10*3/uL (ref 150–400)
RBC: 4.29 MIL/uL (ref 3.87–5.11)
RDW: 14.9 % (ref 11.5–15.5)
WBC: 23.1 10*3/uL — ABNORMAL HIGH (ref 4.0–10.5)
nRBC: 0 % (ref 0.0–0.2)

## 2023-11-26 LAB — LACTIC ACID, PLASMA: Lactic Acid, Venous: 2.1 mmol/L (ref 0.5–1.9)

## 2023-11-26 LAB — SEDIMENTATION RATE: Sed Rate: 15 mm/h (ref 0–22)

## 2023-11-26 LAB — TROPONIN I (HIGH SENSITIVITY): Troponin I (High Sensitivity): 2 ng/L (ref <18)

## 2023-11-26 LAB — COMPREHENSIVE METABOLIC PANEL WITH GFR
ALT: 34 U/L (ref 0–44)
AST: 30 U/L (ref 15–41)
Albumin: 3.9 g/dL (ref 3.5–5.0)
Alkaline Phosphatase: 76 U/L (ref 38–126)
Anion gap: 9 (ref 5–15)
BUN: 20 mg/dL (ref 6–20)
CO2: 22 mmol/L (ref 22–32)
Calcium: 8.9 mg/dL (ref 8.9–10.3)
Chloride: 108 mmol/L (ref 98–111)
Creatinine, Ser: 0.9 mg/dL (ref 0.44–1.00)
GFR, Estimated: >60 mL/min
Glucose, Bld: 156 mg/dL — ABNORMAL HIGH (ref 70–99)
Potassium: 3.8 mmol/L (ref 3.5–5.1)
Sodium: 139 mmol/L (ref 135–145)
Total Bilirubin: 0.5 mg/dL (ref 0.0–1.2)
Total Protein: 7.4 g/dL (ref 6.5–8.1)

## 2023-11-26 LAB — URINALYSIS, ROUTINE W REFLEX MICROSCOPIC
Bacteria, UA: NONE SEEN
Bilirubin Urine: NEGATIVE
Glucose, UA: NEGATIVE mg/dL
Hgb urine dipstick: NEGATIVE
Ketones, ur: NEGATIVE mg/dL
Leukocytes,Ua: NEGATIVE
Nitrite: NEGATIVE
Protein, ur: 30 mg/dL — AB
Specific Gravity, Urine: 1.026 (ref 1.005–1.030)
pH: 5 (ref 5.0–8.0)

## 2023-11-26 LAB — POC URINE PREG, ED: Preg Test, Ur: NEGATIVE

## 2023-11-26 LAB — D-DIMER, QUANTITATIVE: D-Dimer, Quant: 0.39 ug{FEU}/mL (ref 0.00–0.50)

## 2023-11-26 LAB — LIPASE, BLOOD: Lipase: 34 U/L (ref 11–51)

## 2023-11-26 LAB — C-REACTIVE PROTEIN: CRP: 0.5 mg/dL (ref <1.0)

## 2023-11-26 MED ORDER — FAMOTIDINE 20 MG PO TABS: 20.0000 mg | ORAL_TABLET | Freq: Two times a day (BID) | ORAL | 0 refills | Status: DC

## 2023-11-26 MED ORDER — ONDANSETRON 4 MG PO TBDP: ORAL_TABLET | ORAL | 0 refills | Status: DC

## 2023-11-26 MED ORDER — LACTATED RINGERS IV BOLUS
1000.0000 mL | Freq: Once | INTRAVENOUS | Status: AC
  Administered 2023-11-26: 1000 mL via INTRAVENOUS

## 2023-11-26 MED ORDER — ONDANSETRON HCL 4 MG/2ML IJ SOLN
4.0000 mg | Freq: Once | INTRAMUSCULAR | Status: AC
  Administered 2023-11-26: 4 mg via INTRAVENOUS
  Filled 2023-11-26: qty 2

## 2023-11-26 MED ORDER — ALUM & MAG HYDROXIDE-SIMETH 200-200-20 MG/5ML PO SUSP
30.0000 mL | Freq: Once | ORAL | Status: AC
  Administered 2023-11-26: 30 mL via ORAL
  Filled 2023-11-26: qty 30

## 2023-11-26 MED ORDER — PANTOPRAZOLE SODIUM 40 MG PO TBEC: 40.0000 mg | DELAYED_RELEASE_TABLET | Freq: Every day | ORAL | 0 refills | Status: DC

## 2023-11-26 MED ORDER — LIDOCAINE VISCOUS HCL 2 % MT SOLN
15.0000 mL | Freq: Once | OROMUCOSAL | Status: AC
  Administered 2023-11-26: 15 mL via ORAL
  Filled 2023-11-26: qty 15

## 2023-11-26 MED ORDER — ALUM & MAG HYDROXIDE-SIMETH 400-400-40 MG/5ML PO SUSP: 15.0000 mL | Freq: Four times a day (QID) | ORAL | 0 refills | Status: DC | PRN

## 2023-11-26 MED ORDER — PANTOPRAZOLE SODIUM 40 MG PO TBEC
40.0000 mg | DELAYED_RELEASE_TABLET | Freq: Once | ORAL | Status: AC
Start: 2023-11-26 — End: 2023-11-26
  Administered 2023-11-26: 40 mg via ORAL
  Filled 2023-11-26: qty 1

## 2023-11-26 MED ORDER — IOHEXOL 300 MG/ML  SOLN
100.0000 mL | Freq: Once | INTRAMUSCULAR | Status: AC | PRN
  Administered 2023-11-26: 100 mL via INTRAVENOUS

## 2023-11-26 MED ORDER — HYDROMORPHONE HCL 1 MG/ML IJ SOLN
1.0000 mg | Freq: Once | INTRAMUSCULAR | Status: AC
  Administered 2023-11-26: 1 mg via INTRAVENOUS
  Filled 2023-11-26: qty 1

## 2023-11-26 NOTE — ED Notes (Signed)
 Gave patient gingerale for a po challenge.

## 2023-11-26 NOTE — ED Provider Notes (Signed)
 [place] EMERGENCY DEPARTMENT AT [facility] Provider Note   CSN: [id number] Arrival date & time: 11/25/23  2343     History  Chief Complaint  Patient presents with   Abdominal Pain    Karen Hebert is a 53 y.o. female.  53 year old female who presents ER today secondary to abdominal pain.  Patient states it started earlier tonight with some chest pain shortness of breath and some sweatiness.  It was on the left side and radiated down towards her stomach and then progressed to be just in her left upper quadrant or epigastric area.  She states that she has a history of Crohn's disease but she is not sure that is an accurate diagnosis that she is never had any treatment for it.  States she does have chronic bloody stools that have started to have some stringy thick mucus type look to home.  No urinary symptoms.  No diarrhea.  No history of blood clots.   Abdominal Pain      Home Medications Prior to Admission medications   Medication Sig Start Date End Date Taking? Authorizing Provider  alum & mag hydroxide-simeth (MAALOX PLUS) 400-400-40 MG/5ML suspension Take 15 mLs by mouth every 6 (six) hours as needed for indigestion. 11/26/23  Yes Mattisyn Cardona, Barbara Cower, MD  famotidine (PEPCID) 20 MG tablet Take 1 tablet (20 mg total) by mouth 2 (two) times daily. 11/26/23  Yes Kadie Balestrieri, Barbara Cower, MD  ondansetron (ZOFRAN-ODT) 4 MG disintegrating tablet 4mg  ODT q4 hours prn nausea/vomit 11/26/23  Yes Adriana Quinby, Barbara Cower, MD  pantoprazole (PROTONIX) 40 MG tablet Take 1 tablet (40 mg total) by mouth daily. 11/26/23  Yes Inocencia Murtaugh, Barbara Cower, MD  amLODIPine-Valsartan-HCTZ 5-160-12.5 MG TABS Take once daily for blood pressure. 05/06/23   Tollie Eth, NP  cholecalciferol (VITAMIN D3) 25 MCG (1000 UNIT) tablet Take 1,000 Units by mouth daily.    [provider]  metFORMIN (GLUCOPHAGE-XR) 500 MG 24 hr tablet Take 1 tablet (500 mg total) by mouth daily. Take one at dinner may increase to 2 at dinner after 1  week then may increase to 3 at dinner after another week and no issues with the medicine. 07/20/23   Tollie Eth, NP  Multiple Vitamin (MULTIVITAMIN WITH MINERALS) TABS tablet Take 1 tablet by mouth daily.    [provider]  scopolamine (TRANSDERM-SCOP) 1 MG/3DAYS Place 1 patch (1.5 mg total) onto the skin every 3 (three) days. 05/09/23   Tollie Eth, NP  tirzepatide [facility]) 10 MG/0.5ML Pen INJECT 10 MG UNDER THE SKIN WEEKLY 09/28/23   Early, Sung Amabile, NP  tirzepatide [facility]) 5 MG/0.5ML Pen Inject 5 mg into the skin once a week. 05/09/23   Tollie Eth, NP  tirzepatide [facility]) 7.5 MG/0.5ML Pen Inject 7.5 mg into the skin once a week. 05/09/23   Tollie Eth, NP      Allergies    Penicillins    Review of Systems   Review of Systems  Gastrointestinal:  Positive for abdominal pain.    Physical Exam Updated Vital Signs BP 118/68 (BP Location: Right Arm)   Pulse 68   Temp 98.2 F (36.8 C) (Oral)   Resp 18   Ht 5\' 9"  (1.753 m)   Wt 95.3 kg   SpO2 99%   BMI 31.01 kg/m  Physical Exam Vitals and nursing note reviewed.  Constitutional:      Appearance: She is well-developed.  HENT:     Head: Normocephalic and atraumatic.  Cardiovascular:  Rate and Rhythm: Normal rate and regular rhythm.  Pulmonary:     Effort: No respiratory distress.     Breath sounds: No stridor.  Abdominal:     General: There is no distension.     Tenderness: There is no abdominal tenderness.  Musculoskeletal:     Cervical back: Normal range of motion.  Neurological:     Mental Status: She is alert.     ED Results / Procedures / Treatments   Labs (all labs ordered are listed, but only abnormal results are displayed) Labs Reviewed  CBC WITH DIFFERENTIAL/PLATELET - Abnormal; Notable for the following components:      Result Value   WBC 23.1 (*)    Hemoglobin 10.9 (*)    HCT 34.9 (*)    MCH 25.4 (*)    Neutro Abs 19.6 (*)    Monocytes Absolute 1.2 (*)    Abs Immature Granulocytes  0.15 (*)    All other components within normal limits  COMPREHENSIVE METABOLIC PANEL WITH GFR - Abnormal; Notable for the following components:   Glucose, Bld 156 (*)    All other components within normal limits  URINALYSIS, ROUTINE W REFLEX MICROSCOPIC - Abnormal; Notable for the following components:   Protein, ur 30 (*)    All other components within normal limits  LACTIC ACID, PLASMA - Abnormal; Notable for the following components:   Lactic Acid, Venous 2.1 (*)    All other components within normal limits  LIPASE, BLOOD  SEDIMENTATION RATE  D-DIMER, QUANTITATIVE  C-REACTIVE PROTEIN  POC URINE PREG, ED  TROPONIN I (HIGH SENSITIVITY)    EKG EKG Interpretation Date/Time:  Saturday November 26 2023 00:13:12 EDT Ventricular Rate:  85 PR Interval:  167 QRS Duration:  85 QT Interval:  369 QTC Calculation: 439 R Axis:   83  Text Interpretation: Sinus rhythm Consider anterior infarct Confirmed by Marily Memos [phone]) on 11/26/2023 1:10:50 AM  Radiology CT ABDOMEN PELVIS W CONTRAST Result Date: 11/26/2023 CLINICAL DATA:  Epigastric pain. History of Crohn's disease. Nausea, vomiting. EXAM: CT ABDOMEN AND PELVIS WITH CONTRAST TECHNIQUE: Multidetector CT imaging of the abdomen and pelvis was performed using the standard protocol following bolus administration of intravenous contrast. RADIATION DOSE REDUCTION: This exam was performed according to the departmental dose-optimization program which includes automated exposure control, adjustment of the mA and/or kV according to patient size and/or use of iterative reconstruction technique. CONTRAST:  OMNIPAQUE IOHEXOL 300 MG/ML  SOLN COMPARISON:  01/12/2023 FINDINGS: Lower chest: Dependent atelectasis.  No effusions. Hepatobiliary: Prior cholecystectomy. Diffuse fatty infiltration of the liver. No focal hepatic abnormality. Pancreas: No focal abnormality or ductal dilatation. Spleen: No focal abnormality.  Normal size. Adrenals/Urinary Tract:  No adrenal abnormality. No focal renal abnormality. No stones or hydronephrosis. Urinary bladder is unremarkable. Stomach/Bowel: Stomach, large and small bowel grossly unremarkable. No bowel obstruction or inflammatory process. Vascular/Lymphatic: No evidence of aneurysm or adenopathy. Reproductive: Uterus and adnexa unremarkable.  No mass. Other: No free fluid or free air. Musculoskeletal: No acute bony abnormality. IMPRESSION: No acute findings in the abdomen or pelvis. Hepatic steatosis. Electronically Signed   By: Charlett Nose M.D.   On: 11/26/2023 01:48    Procedures Procedures    Medications Ordered in ED Medications  lactated ringers bolus 1,000 mL (0 mLs Intravenous Stopped 11/26/23 0230)  HYDROmorphone (DILAUDID) injection 1 mg (1 mg Intravenous Given 11/26/23 0018)  ondansetron (ZOFRAN) injection 4 mg (4 mg Intravenous Given 11/26/23 0019)  iohexol (OMNIPAQUE) 300 MG/ML solution 100 mL (  100 mLs Intravenous Contrast Given 11/26/23 0124)  pantoprazole (PROTONIX) EC tablet 40 mg (40 mg Oral Given 11/26/23 0229)  alum & mag hydroxide-simeth (MAALOX/MYLANTA) 200-200-20 MG/5ML suspension 30 mL (30 mLs Oral Given 11/26/23 0229)    And  lidocaine (XYLOCAINE) 2 % viscous mouth solution 15 mL (15 mLs Oral Given 11/26/23 0229)    ED Course/ Medical Decision Making/ A&P                                 Medical Decision Making Amount and/or Complexity of Data Reviewed Labs: ordered. Radiology: ordered. ECG/medicine tests: ordered.  Risk OTC drugs. Prescription drug management.  Not sure if patient actually has a history of Crohn's or not but with chronic bloody stool she definitely does see a gastroenterologist at some point.  Initial evaluation concerning for possible gastritis but with the unclarity of the inflammatory bowel disease we will check troponin, lactic acid, ESR CRP and D-dimer to evaluate for blood clot and general inflammation.  Will treat her symptoms at this  time.  Leukocytosis but ESR is within normal limits.  D-dimer negative.  Will get CT scan to evaluate for any kind of inflammation obstruction or other issues.  Symptoms improving.  Symptoms improved.  Tolerating p.o.  CT scan without any obvious causes for her symptoms.  I strongly suggested GI follow-up secondary to the anemia, leukocytosis and chronic GI bleeding for colonoscopy.  Referral placed.  Stable for discharge.   Final Clinical Impression(s) / ED Diagnoses Final diagnoses:  Epigastric pain  Hematochezia  Leukocytosis, unspecified type  Anemia, unspecified type    Rx / DC Orders ED Discharge Orders          Ordered    ondansetron (ZOFRAN-ODT) 4 MG disintegrating tablet        11/26/23 0335    pantoprazole (PROTONIX) 40 MG tablet  Daily        11/26/23 0335    famotidine (PEPCID) 20 MG tablet  2 times daily        11/26/23 0335    alum & mag hydroxide-simeth (MAALOX PLUS) 400-400-40 MG/5ML suspension  Every 6 hours PRN        11/26/23 0335    Ambulatory referral to Gastroenterology        11/26/23 0335              Toddy Boyd, Barbara Cower, MD 11/26/23 [phone]

## 2023-11-26 NOTE — ED Notes (Signed)
 Pt lactic acid is 2.1, Dr Clayborne Dana notified.

## 2023-11-28 NOTE — Progress Notes (Deleted)
 Referring Provider: Tollie Eth, NP Primary Care Physician:  Tollie Eth, NP Primary GI Physician: Dr. Levon Hedger  No chief complaint on file.   HPI:   Karen Hebert is a 53 y.o. female presenting today at the request of  Early, Sung Amabile, NP for abdominal pain.  Patient was seen in the ER 11/25/2023 with LUQ and epigastric abdominal pain.  Reported history of Crohn's disease with chronic bloody stools though she was not sure this was an accurate diagnosis that she had never been on any treatment for it.  Laboratory evaluation remarkable for hemoglobin 10.9, WBC 23.1, lactic acid 2.1, CRP, sed rate, D-dimer, lipase, troponin within normal limits.  CT A/P with contrast showed no acute abnormalities.  She received analgesics, antiemetics, IV fluids with improvement in symptoms.  She was advised to see GI in the outpatient setting.  Discharged on Zofran, pantoprazole 40 mg daily, Pepcid 20 mg twice daily, and Maalox as needed.  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 355 mL 0   amLODIPine-Valsartan-HCTZ 5-160-12.5 MG TABS Take once daily for blood pressure. 90 tablet 1   cholecalciferol (VITAMIN D3) 25 MCG (1000 UNIT) tablet Take 1,000 Units by mouth daily.     famotidine (PEPCID) 20  MG tablet Take 1 tablet (20 mg total) by mouth 2 (two) times daily. 10 tablet 0   metFORMIN (GLUCOPHAGE-XR) 500 MG 24 hr tablet Take 1 tablet (500 mg total) by mouth daily. Take one at dinner may increase to 2 at dinner after 1 week then may increase to 3 at dinner after another week and no issues with the medicine. 180 tablet 1   Multiple Vitamin (MULTIVITAMIN WITH MINERALS) TABS tablet Take 1 tablet by mouth daily.     ondansetron (ZOFRAN-ODT) 4 MG disintegrating tablet 4mg  ODT q4 hours prn nausea/vomit 4 tablet 0   pantoprazole (PROTONIX) 40 MG tablet Take 1 tablet (40 mg total) by mouth daily. 14 tablet 0   scopolamine (TRANSDERM-SCOP) 1 MG/3DAYS Place 1 patch (1.5 mg total) onto the skin every 3 (three) days. 8 patch 1   tirzepatide (MOUNJARO) 10 MG/0.5ML Pen INJECT 10 MG UNDER THE SKIN WEEKLY 6 mL 1   tirzepatide (MOUNJARO) 5 MG/0.5ML Pen Inject 5 mg into the skin once a week. 2 mL 0   tirzepatide (MOUNJARO) 7.5 MG/0.5ML Pen Inject 7.5 mg into the skin once a week. 2 mL 0   No current facility-administered medications for this visit.    Allergies as of 11/30/2023 - Review Complete 11/25/2023  Allergen Reaction Noted   Penicillins Anaphylaxis 12/02/2020    Family History  Adopted: Yes  Family history unknown: Yes    Social History   Socioeconomic History   Marital status: Married  Spouse name: Not on file   Number of children: Not on file   Years of education: Not on file   Highest education level: Not on file  Occupational History   Not on file  Tobacco Use   Smoking status: Never   Smokeless tobacco: Never  Vaping Use   Vaping status: Never Used  Substance and Sexual Activity   Alcohol use: Not Currently   Drug use: Not Currently   Sexual activity: Not on file  Other Topics Concern   Not on file  Social History Narrative   Not on file   Social Drivers of Health   Financial Resource Strain: Not on file  Food Insecurity: No Food Insecurity (01/13/2023)    Hunger Vital Sign    Worried About Running Out of Food in the Last Year: Never true    Ran Out of Food in the Last Year: Never true  Transportation Needs: No Transportation Needs (01/13/2023)   PRAPARE - Administrator, Civil Service (Medical): No    Lack of Transportation (Non-Medical): No  Physical Activity: Not on file  Stress: Not on file  Social Connections: Not on file    Review of Systems: Gen: Denies fever, chills, anorexia. Denies fatigue, weakness, weight loss.  CV: Denies chest pain, palpitations, syncope, peripheral edema, and claudication. Resp: Denies dyspnea at rest, cough, wheezing, coughing up blood, and pleurisy. GI: Denies vomiting blood, jaundice, and fecal incontinence.   Denies dysphagia or odynophagia. Derm: Denies rash, itching, dry skin Psych: Denies depression, anxiety, memory loss, confusion. No homicidal or suicidal ideation.  Heme: Denies bruising, bleeding, and enlarged lymph nodes.  Physical Exam: There were no vitals taken for this visit. General:   Alert and oriented. No distress noted. Pleasant and cooperative.  Head:  Normocephalic and atraumatic. Eyes:  Conjuctiva clear without scleral icterus. Heart:  S1, S2 present without murmurs appreciated. Lungs:  Clear to auscultation bilaterally. No wheezes, rales, or rhonchi. No distress.  Abdomen:  +BS, soft, non-tender and non-distended. No rebound or guarding. No HSM or masses noted. Msk:  Symmetrical without gross deformities. Normal posture. Extremities:  Without edema. Neurologic:  Alert and  oriented x4 Psych:  Normal mood and affect.    Assessment:     Plan:  ***   Ermalinda Memos, PA-C [facility] 11/30/2023

## 2023-11-30 ENCOUNTER — Encounter: Payer: Self-pay | Admitting: *Deleted

## 2023-11-30 ENCOUNTER — Ambulatory Visit: Admitting: Gastroenterology

## 2023-12-05 NOTE — H&P (View-Only) (Signed)
 GI Office Note    Referring Provider: Early, Sung Amabile, NP Primary Care Physician:  Tollie Eth, NP Primary Gastroenterologist: Hennie Duos. [name], DO  Date:  12/06/2023  ID:  Karen Hebert, DOB Sep 10, 1970, MRN [id number]   Chief Complaint   Chief Complaint  Patient presents with   Abdominal Pain    Stomach pains, change in bowel habits. Hemorrhoids    History of Present Illness  Karen Hebert is a 53 y.o. female with a history of diabetes, HTN, and constipation presenting today with complaint of   Last seen during hospitalization in May 2024 by Tana Coast, PA-C. Seen for constipation and rectal bleedng. External hemorrhoids swollen and bleeding post enema. Concern for stercoral colitis on CT. She also presented with elevated LFTs. CT with patchy fatty infiltration of the liver. Denied herbal medications. Viral hepatitis and HIV labs negative. She was given bowel prep for constipation as well as milk of molasses enema. Advised linzess on discharge. Constipation likely secondary to medication (GLP-1). Failed many otc laxatives.   ED visit 11/25/23 for LUQ/epigastric pain, chest pain, SOB, and diaphoresis. She reported possible chrons diagnosis but no treatment. Reported chronic bloody stools with stringy thick mucus. Given dilaudid, zofran, protonix, GI cocktail! CT with normal stomach, bowel, liver, pancreas, spleen.      Latest Ref Rng & Units 11/26/2023   12:04 AM 01/13/2023    4:53 AM 01/12/2023    9:10 PM  CBC  WBC 4.0 - 10.5 K/uL 23.1  7.0    Hemoglobin 12.0 - 15.0 g/dL 40.9  81.1  91.4   Hematocrit 36.0 - 46.0 % 34.9  37.2  39.0   Platelets 150 - 400 K/uL 371  252     LFTs within normal limits. Normal lipase, CRP, ESR, UA.  Today:  Very rare if she does not have bleeding from hemorrhoids. She has lots of burning and last night was doing warm compresses almost every hour to help with discomfort and that does help some. Has stringy blood that comes out as well with  her bowel movements. Picture she showed is having firm pieces coming from her rectum.   Typically has constipation. But since she has been taking metformin she has been going regularly (5-6 months). Either has watery stools or does not go. Last night she used an enema. She reports weight gain usually with lack of Bms. Uses lemon balm that she takes in the mornings but nothing. She did take fiber gummies last night. Has been 2 days so far without a BM. Does strain a lot with bowel movements. In the past she has used magnesium drink and eventually that stopped working.   Her PCP started ozempic but she repots she was gaining weight and feeling worse so now on mounjaro.   Last colonoscopy was many years ago - about 10 years ago. She had EGD as well - reports ulcers or other possible inflammation.   Normally does not have much abdominal pain but recently she was having more upper abdominal pain under breast region and was extremely painful. She was feeling very nauseas and vomited several times and had extreme diarrhea as well. Was not able to keep anything down at that time and by the time she did go she was dehydrated and in extreme pain. No recurrence since then. Typically does have acid reflux but not as much now with some of her weight loss (about 20lbs - intentional).  As a child she tried lots of over  the counter stuff - fiber, castor oil, miralax and that has not worked in the past. She reports it was taken off the market but a medication that worked well for her. Noral BM are once every 3-4 days. Has the associated bloating feeling along with this as well.   Tries to eat iron   No melena,overall normal appetite.  Adopted - does not know family history.   Uterine ablation done many years ago because of menorrhagia - no longer has menstrual cycle.   Eats lunch to dinner and then fasts for 17 hours.   Wt Readings from Last 3 Encounters:  12/06/23 214 lb 6.4 oz (97.3 kg)  11/25/23 210 lb  (95.3 kg)  05/06/23 221 lb (100.2 kg)    Current Outpatient Medications  Medication Sig Dispense Refill   cholecalciferol (VITAMIN D3) 25 MCG (1000 UNIT) tablet Take 1,000 Units by mouth daily.     metFORMIN (GLUCOPHAGE-XR) 500 MG 24 hr tablet Take 1 tablet (500 mg total) by mouth daily. Take one at dinner may increase to 2 at dinner after 1 week then may increase to 3 at dinner after another week and no issues with the medicine. 180 tablet 1   Multiple Vitamin (MULTIVITAMIN WITH MINERALS) TABS tablet Take 1 tablet by mouth daily.     ondansetron (ZOFRAN-ODT) 4 MG disintegrating tablet 4mg  ODT q4 hours prn nausea/vomit 4 tablet 0   pantoprazole (PROTONIX) 40 MG tablet Take 1 tablet (40 mg total) by mouth daily. 14 tablet 0   tirzepatide (MOUNJARO) 10 MG/0.5ML Pen INJECT 10 MG UNDER THE SKIN WEEKLY 6 mL 1   alum & mag hydroxide-simeth (MAALOX PLUS) 400-400-40 MG/5ML suspension Take 15 mLs by mouth every 6 (six) hours as needed for indigestion. 355 mL 0   amLODIPine-Valsartan-HCTZ 5-160-12.5 MG TABS Take once daily for blood pressure. 90 tablet 1   famotidine (PEPCID) 20 MG tablet Take 1 tablet (20 mg total) by mouth 2 (two) times daily. (Patient not taking: Reported on 12/06/2023) 10 tablet 0   scopolamine (TRANSDERM-SCOP) 1 MG/3DAYS Place 1 patch (1.5 mg total) onto the skin every 3 (three) days. 8 patch 1   tirzepatide (MOUNJARO) 5 MG/0.5ML Pen Inject 5 mg into the skin once a week. 2 mL 0   No current facility-administered medications for this visit.    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   Transaminasemia 01/13/2023     Past Surgical History:  Procedure Laterality Date   APPENDECTOMY     CHOLECYSTECTOMY      Family History  Adopted: Yes  Family history unknown: Yes    Allergies as of 12/06/2023 - Review Complete 12/06/2023  Allergen Reaction Noted   Penicillins Anaphylaxis 12/02/2020    Social History   Socioeconomic History   Marital status: Married    Spouse name: Not on file   Number of children: Not on file   Years of education: Not on file   Highest education level: Not on file  Occupational History   Not on file  Tobacco Use   Smoking status: Never   Smokeless tobacco: Never  Vaping  Use   Vaping status: Never Used  Substance and Sexual Activity   Alcohol use: Not Currently   Drug use: Not Currently   Sexual activity: Not on file  Other Topics Concern   Not on file  Social History Narrative   Not on file   Social Drivers of Health   Financial Resource Strain: Not on file  Food Insecurity: No Food Insecurity (01/13/2023)   Hunger Vital Sign    Worried About Running Out of Food in the Last Year: Never true    Ran Out of Food in the Last Year: Never true  Transportation Needs: No Transportation Needs (01/13/2023)   PRAPARE - Administrator, Civil Service (Medical): No    Lack of Transportation (Non-Medical): No  Physical Activity: Not on file  Stress: Not on file  Social Connections: Not on file     Review of Systems   Gen: Denies fever, chills, anorexia. Denies fatigue, weakness, weight loss.  CV: Denies chest pain, palpitations, syncope, peripheral edema, and claudication. Resp: Denies dyspnea at rest, cough, wheezing, coughing up blood, and pleurisy. GI: See HPI Derm: Denies rash, itching, dry skin Psych: Denies depression, anxiety, memory loss, confusion. No homicidal or suicidal ideation.  Heme: Denies bruising, bleeding, and enlarged lymph nodes.  Physical Exam   BP 138/89 (BP Location: Right Arm, Patient Position: Sitting, Cuff Size: Large)    Pulse 83   Temp 97.7 F (36.5 C) (Temporal)   Ht 5\' 9"  (1.753 m)   Wt 214 lb 6.4 oz (97.3 kg)   BMI 31.66 kg/m   General:   Alert and oriented. No distress noted. Pleasant and cooperative.  Head:  Normocephalic and atraumatic. Eyes:  Conjuctiva clear without scleral icterus. Mouth:  Oral mucosa pink and moist. Good dentition. No lesions. Lungs:  Clear to auscultation bilaterally. No wheezes, rales, or rhonchi. No distress.  Heart:  S1, S2 present without murmurs appreciated.  Rectal: mucous and blood present after DRE. Tight anal sphincter noted. Palpable internal hemorrhoid tissue and some mild mucosal prolapse evident.  Msk:  Symmetrical without gross deformities. Normal posture. Extremities:  Without edema. Neurologic:  Alert and  oriented x4 Psych:  Alert and cooperative. Normal mood and affect.  Assessment  Clora Klohe Lovering is a 53 y.o. female with a history of Chron's colitis?, diabetes, HTN, constipation with prior fecal impaction presenting today to discuss hemorrhoids and rectal bleeding.   GERD, Epigastric pain: - Recent epigastric pain likely secondary to viral illness given it was associated with nausea and diarrhea - Does have occasional reflux symptoms, uses Tums as needed - Denies any dysphagia - Will rule out H. pylori with biopsies from EGD  Rectal bleeding: - Having consistent rectal bleeding, happens more days than not -Most recently having possible sloughing of tissue based on picture provided by patient. -Per chart review, history of Crohn's colitis listed however not confirmed, unable to find a biopsy. -Requesting prior EGD and colonoscopy records -Rectal exam today with evidence of palpable hemorrhoids and tight anal sphincter -Need to repeat colonoscopy given rectal bleeding and anemia and to confirm or rule out IBD -Will assess fecal calprotectin  Constipation: - History of chronic constipation - Has tried over-the-counter regimens past as well as  fiber -Will trial Linzess 72 mcg, counseled on washout period - Will augment her colonoscopy prep with high-dose Linzess  Anemia: - Most recent hemoglobin 10.9, down from 12.2 in May of last year. - Consistent rectal bleeding, even prior to May last  year - Will check iron panel as well as celiac, not currently on iron supplementation - Denies any melena or overt abdominal pain, weight loss, lack of appetite, dizziness, chest pain, or shortness of breath - Will obtain EGD for evaluation of anemia as well as her previous epigastric pain and reflux  PLAN   Proceed with upper endoscopy and colonoscopy with propofol by Dr. [name] in near future: the risks, benefits, and alternatives have been discussed with the patient in detail. The patient states understanding and desires to proceed. ASA 2 Linzess 290 for 4 days prior.  Hold mounjaro for 1 week Hold metformin morning of procedure.  Iron panel, celiac, fecal calprotectin Can trial Linzess 72 mcg once daily. Continue magnesium supplement GERD diet Tums as needed for reflux Request prior EGD and colonoscopy records Follow up post procedures.     Brooke Bonito, MSN, FNP-BC, AGACNP-BC [facility]

## 2023-12-05 NOTE — Progress Notes (Unsigned)
 GI Office Note    Referring Provider: Early, Sung Amabile, NP Primary Care Physician:  Tollie Eth, NP Primary Gastroenterologist: Hennie Duos. [name], DO  Date:  12/06/2023  ID:  Karen Hebert, DOB Sep 10, 1970, MRN [id number]   Chief Complaint   Chief Complaint  Patient presents with   Abdominal Pain    Stomach pains, change in bowel habits. Hemorrhoids    History of Present Illness  Karen Hebert is a 53 y.o. female with a history of diabetes, HTN, and constipation presenting today with complaint of   Last seen during hospitalization in May 2024 by Tana Coast, PA-C. Seen for constipation and rectal bleedng. External hemorrhoids swollen and bleeding post enema. Concern for stercoral colitis on CT. She also presented with elevated LFTs. CT with patchy fatty infiltration of the liver. Denied herbal medications. Viral hepatitis and HIV labs negative. She was given bowel prep for constipation as well as milk of molasses enema. Advised linzess on discharge. Constipation likely secondary to medication (GLP-1). Failed many otc laxatives.   ED visit 11/25/23 for LUQ/epigastric pain, chest pain, SOB, and diaphoresis. She reported possible chrons diagnosis but no treatment. Reported chronic bloody stools with stringy thick mucus. Given dilaudid, zofran, protonix, GI cocktail! CT with normal stomach, bowel, liver, pancreas, spleen.      Latest Ref Rng & Units 11/26/2023   12:04 AM 01/13/2023    4:53 AM 01/12/2023    9:10 PM  CBC  WBC 4.0 - 10.5 K/uL 23.1  7.0    Hemoglobin 12.0 - 15.0 g/dL 40.9  81.1  91.4   Hematocrit 36.0 - 46.0 % 34.9  37.2  39.0   Platelets 150 - 400 K/uL 371  252     LFTs within normal limits. Normal lipase, CRP, ESR, UA.  Today:  Very rare if she does not have bleeding from hemorrhoids. She has lots of burning and last night was doing warm compresses almost every hour to help with discomfort and that does help some. Has stringy blood that comes out as well with  her bowel movements. Picture she showed is having firm pieces coming from her rectum.   Typically has constipation. But since she has been taking metformin she has been going regularly (5-6 months). Either has watery stools or does not go. Last night she used an enema. She reports weight gain usually with lack of Bms. Uses lemon balm that she takes in the mornings but nothing. She did take fiber gummies last night. Has been 2 days so far without a BM. Does strain a lot with bowel movements. In the past she has used magnesium drink and eventually that stopped working.   Her PCP started ozempic but she repots she was gaining weight and feeling worse so now on mounjaro.   Last colonoscopy was many years ago - about 10 years ago. She had EGD as well - reports ulcers or other possible inflammation.   Normally does not have much abdominal pain but recently she was having more upper abdominal pain under breast region and was extremely painful. She was feeling very nauseas and vomited several times and had extreme diarrhea as well. Was not able to keep anything down at that time and by the time she did go she was dehydrated and in extreme pain. No recurrence since then. Typically does have acid reflux but not as much now with some of her weight loss (about 20lbs - intentional).  As a child she tried lots of over  the counter stuff - fiber, castor oil, miralax and that has not worked in the past. She reports it was taken off the market but a medication that worked well for her. Noral BM are once every 3-4 days. Has the associated bloating feeling along with this as well.   Tries to eat iron   No melena,overall normal appetite.  Adopted - does not know family history.   Uterine ablation done many years ago because of menorrhagia - no longer has menstrual cycle.   Eats lunch to dinner and then fasts for 17 hours.   Wt Readings from Last 3 Encounters:  12/06/23 214 lb 6.4 oz (97.3 kg)  11/25/23 210 lb  (95.3 kg)  05/06/23 221 lb (100.2 kg)    Current Outpatient Medications  Medication Sig Dispense Refill   cholecalciferol (VITAMIN D3) 25 MCG (1000 UNIT) tablet Take 1,000 Units by mouth daily.     metFORMIN (GLUCOPHAGE-XR) 500 MG 24 hr tablet Take 1 tablet (500 mg total) by mouth daily. Take one at dinner may increase to 2 at dinner after 1 week then may increase to 3 at dinner after another week and no issues with the medicine. 180 tablet 1   Multiple Vitamin (MULTIVITAMIN WITH MINERALS) TABS tablet Take 1 tablet by mouth daily.     ondansetron (ZOFRAN-ODT) 4 MG disintegrating tablet 4mg  ODT q4 hours prn nausea/vomit 4 tablet 0   pantoprazole (PROTONIX) 40 MG tablet Take 1 tablet (40 mg total) by mouth daily. 14 tablet 0   tirzepatide (MOUNJARO) 10 MG/0.5ML Pen INJECT 10 MG UNDER THE SKIN WEEKLY 6 mL 1   alum & mag hydroxide-simeth (MAALOX PLUS) 400-400-40 MG/5ML suspension Take 15 mLs by mouth every 6 (six) hours as needed for indigestion. 355 mL 0   amLODIPine-Valsartan-HCTZ 5-160-12.5 MG TABS Take once daily for blood pressure. 90 tablet 1   famotidine (PEPCID) 20 MG tablet Take 1 tablet (20 mg total) by mouth 2 (two) times daily. (Patient not taking: Reported on 12/06/2023) 10 tablet 0   scopolamine (TRANSDERM-SCOP) 1 MG/3DAYS Place 1 patch (1.5 mg total) onto the skin every 3 (three) days. 8 patch 1   tirzepatide (MOUNJARO) 5 MG/0.5ML Pen Inject 5 mg into the skin once a week. 2 mL 0   No current facility-administered medications for this visit.    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   Transaminasemia 01/13/2023     Past Surgical History:  Procedure Laterality Date   APPENDECTOMY     CHOLECYSTECTOMY      Family History  Adopted: Yes  Family history unknown: Yes    Allergies as of 12/06/2023 - Review Complete 12/06/2023  Allergen Reaction Noted   Penicillins Anaphylaxis 12/02/2020    Social History   Socioeconomic History   Marital status: Married    Spouse name: Not on file   Number of children: Not on file   Years of education: Not on file   Highest education level: Not on file  Occupational History   Not on file  Tobacco Use   Smoking status: Never   Smokeless tobacco: Never  Vaping  Use   Vaping status: Never Used  Substance and Sexual Activity   Alcohol use: Not Currently   Drug use: Not Currently   Sexual activity: Not on file  Other Topics Concern   Not on file  Social History Narrative   Not on file   Social Drivers of Health   Financial Resource Strain: Not on file  Food Insecurity: No Food Insecurity (01/13/2023)   Hunger Vital Sign    Worried About Running Out of Food in the Last Year: Never true    Ran Out of Food in the Last Year: Never true  Transportation Needs: No Transportation Needs (01/13/2023)   PRAPARE - Administrator, Civil Service (Medical): No    Lack of Transportation (Non-Medical): No  Physical Activity: Not on file  Stress: Not on file  Social Connections: Not on file     Review of Systems   Gen: Denies fever, chills, anorexia. Denies fatigue, weakness, weight loss.  CV: Denies chest pain, palpitations, syncope, peripheral edema, and claudication. Resp: Denies dyspnea at rest, cough, wheezing, coughing up blood, and pleurisy. GI: See HPI Derm: Denies rash, itching, dry skin Psych: Denies depression, anxiety, memory loss, confusion. No homicidal or suicidal ideation.  Heme: Denies bruising, bleeding, and enlarged lymph nodes.  Physical Exam   BP 138/89 (BP Location: Right Arm, Patient Position: Sitting, Cuff Size: Large)    Pulse 83   Temp 97.7 F (36.5 C) (Temporal)   Ht 5\' 9"  (1.753 m)   Wt 214 lb 6.4 oz (97.3 kg)   BMI 31.66 kg/m   General:   Alert and oriented. No distress noted. Pleasant and cooperative.  Head:  Normocephalic and atraumatic. Eyes:  Conjuctiva clear without scleral icterus. Mouth:  Oral mucosa pink and moist. Good dentition. No lesions. Lungs:  Clear to auscultation bilaterally. No wheezes, rales, or rhonchi. No distress.  Heart:  S1, S2 present without murmurs appreciated.  Rectal: mucous and blood present after DRE. Tight anal sphincter noted. Palpable internal hemorrhoid tissue and some mild mucosal prolapse evident.  Msk:  Symmetrical without gross deformities. Normal posture. Extremities:  Without edema. Neurologic:  Alert and  oriented x4 Psych:  Alert and cooperative. Normal mood and affect.  Assessment  Clora Klohe Lovering is a 53 y.o. female with a history of Chron's colitis?, diabetes, HTN, constipation with prior fecal impaction presenting today to discuss hemorrhoids and rectal bleeding.   GERD, Epigastric pain: - Recent epigastric pain likely secondary to viral illness given it was associated with nausea and diarrhea - Does have occasional reflux symptoms, uses Tums as needed - Denies any dysphagia - Will rule out H. pylori with biopsies from EGD  Rectal bleeding: - Having consistent rectal bleeding, happens more days than not -Most recently having possible sloughing of tissue based on picture provided by patient. -Per chart review, history of Crohn's colitis listed however not confirmed, unable to find a biopsy. -Requesting prior EGD and colonoscopy records -Rectal exam today with evidence of palpable hemorrhoids and tight anal sphincter -Need to repeat colonoscopy given rectal bleeding and anemia and to confirm or rule out IBD -Will assess fecal calprotectin  Constipation: - History of chronic constipation - Has tried over-the-counter regimens past as well as  fiber -Will trial Linzess 72 mcg, counseled on washout period - Will augment her colonoscopy prep with high-dose Linzess  Anemia: - Most recent hemoglobin 10.9, down from 12.2 in May of last year. - Consistent rectal bleeding, even prior to May last  year - Will check iron panel as well as celiac, not currently on iron supplementation - Denies any melena or overt abdominal pain, weight loss, lack of appetite, dizziness, chest pain, or shortness of breath - Will obtain EGD for evaluation of anemia as well as her previous epigastric pain and reflux  PLAN   Proceed with upper endoscopy and colonoscopy with propofol by Dr. [name] in near future: the risks, benefits, and alternatives have been discussed with the patient in detail. The patient states understanding and desires to proceed. ASA 2 Linzess 290 for 4 days prior.  Hold mounjaro for 1 week Hold metformin morning of procedure.  Iron panel, celiac, fecal calprotectin Can trial Linzess 72 mcg once daily. Continue magnesium supplement GERD diet Tums as needed for reflux Request prior EGD and colonoscopy records Follow up post procedures.     Brooke Bonito, MSN, FNP-BC, AGACNP-BC [facility]

## 2023-12-06 ENCOUNTER — Ambulatory Visit (INDEPENDENT_AMBULATORY_CARE_PROVIDER_SITE_OTHER): Admitting: Gastroenterology

## 2023-12-06 ENCOUNTER — Encounter: Payer: Self-pay | Admitting: Gastroenterology

## 2023-12-06 ENCOUNTER — Other Ambulatory Visit: Payer: Self-pay | Admitting: Nurse Practitioner

## 2023-12-06 ENCOUNTER — Encounter: Payer: Self-pay | Admitting: *Deleted

## 2023-12-06 VITALS — BP 138/89 | HR 83 | Temp 97.7°F | Ht 69.0 in | Wt 214.4 lb

## 2023-12-06 DIAGNOSIS — K625 Hemorrhage of anus and rectum: Secondary | ICD-10-CM

## 2023-12-06 DIAGNOSIS — K219 Gastro-esophageal reflux disease without esophagitis: Secondary | ICD-10-CM

## 2023-12-06 DIAGNOSIS — K5909 Other constipation: Secondary | ICD-10-CM

## 2023-12-06 DIAGNOSIS — R1013 Epigastric pain: Secondary | ICD-10-CM

## 2023-12-06 DIAGNOSIS — D649 Anemia, unspecified: Secondary | ICD-10-CM

## 2023-12-06 DIAGNOSIS — K5904 Chronic idiopathic constipation: Secondary | ICD-10-CM

## 2023-12-06 MED ORDER — PEG 3350-KCL-NA BICARB-NACL 420 G PO SOLR: 4000.0000 mL | Freq: Once | ORAL | 0 refills | Status: AC

## 2023-12-06 NOTE — Telephone Encounter (Signed)
 PA approved via [employer] for EGD CPT Code: GEEGD Description: EGD-esophagogastroduodenoscopy Authorization Number: [id number] Case Number: [id number] Review Date: 12/06/2023 11:49:48 AM Expiration Date: 06/03/2024 Status: Your case has been Approved.

## 2023-12-06 NOTE — Patient Instructions (Addendum)
 You can use rectal lidocaine over-the-counter to help with rectal pain.  You may also use over-the-counter hemorrhoid cream, some brands of a combination of hemorrhoid cream with rectal lidocaine.  Please have blood work completed at [facility].  We will call you with results once they have been received. Please allow 3-5 business days for review. 2 locations for [facility] in [place]:              1. [address], [place]              2. [address] [place]   We are scheduling you for upper endoscopy and colonoscopy in the near future with Dr. [name].   How to take Linzess: Once a day every day on empty stomach, at least 30 minutes before your first meal of the day. It is best to keep medications at a stable temperature Medication is best kept in its original bottle with the disket present.  It is a medication that is meant for everyday use and not to be used as needed.    What to expect: Constipation relief is typically felt in about 1 week Relief of abdominal pain, discomfort, and bloating begins in about 1 week with symptoms typically improving over 12 weeks and beyond. Diarrhea is most common side effect and typically begins within the first 2 weeks and can take 3-4 weeks to resolve It would be helpful to begin treatment over the weekend or when you can be closer to a bathroom   You can go to [email or web address] for patient support and sign up for daily medication reminders.   You may continue your magnesium supplements.   Follow a GERD diet:  Avoid fried, fatty, greasy, spicy, citrus foods. Avoid caffeine and carbonated beverages. Avoid chocolate. Try eating 4-6 small meals a day rather than 3 large meals. Do not eat within 3 hours of laying down. Prop head of bed up on wood or bricks to create a 6 inch incline.  It was a pleasure to see you today. I want to create trusting relationships with patients. If you receive a survey regarding your visit,  I  greatly appreciate you taking time to fill this out on paper or through your MyChart. I value your feedback.  Brooke Bonito, MSN, FNP-BC, AGACNP-BC [facility]

## 2023-12-14 ENCOUNTER — Other Ambulatory Visit: Payer: Self-pay

## 2023-12-14 MED ORDER — METFORMIN HCL ER 500 MG PO TB24: 1500.0000 mg | ORAL_TABLET | Freq: Every day | ORAL | 1 refills | Status: DC

## 2023-12-20 ENCOUNTER — Telehealth: Payer: Self-pay | Admitting: Internal Medicine

## 2023-12-20 ENCOUNTER — Ambulatory Visit (HOSPITAL_COMMUNITY): Admitting: Anesthesiology

## 2023-12-20 ENCOUNTER — Encounter (HOSPITAL_COMMUNITY)
Admission: RE | Disposition: A | Discharge status: Home / Self Care | Payer: Self-pay | Source: Home / Self Care | Attending: Internal Medicine

## 2023-12-20 ENCOUNTER — Ambulatory Visit (HOSPITAL_BASED_OUTPATIENT_CLINIC_OR_DEPARTMENT_OTHER): Admitting: Anesthesiology

## 2023-12-20 ENCOUNTER — Other Ambulatory Visit: Payer: Self-pay | Admitting: *Deleted

## 2023-12-20 ENCOUNTER — Other Ambulatory Visit: Payer: Self-pay

## 2023-12-20 ENCOUNTER — Ambulatory Visit (HOSPITAL_COMMUNITY)
Admission: RE | Admit: 2023-12-20 | Discharge: 2023-12-20 | Disposition: A | Discharge status: Home / Self Care | Attending: Internal Medicine | Admitting: Internal Medicine

## 2023-12-20 ENCOUNTER — Encounter (HOSPITAL_COMMUNITY): Payer: Self-pay | Admitting: Internal Medicine

## 2023-12-20 DIAGNOSIS — K2100 Gastro-esophageal reflux disease with esophagitis, without bleeding: Secondary | ICD-10-CM | POA: Diagnosis not present

## 2023-12-20 DIAGNOSIS — K219 Gastro-esophageal reflux disease without esophagitis: Secondary | ICD-10-CM

## 2023-12-20 DIAGNOSIS — D509 Iron deficiency anemia, unspecified: Secondary | ICD-10-CM | POA: Diagnosis present

## 2023-12-20 DIAGNOSIS — K648 Other hemorrhoids: Secondary | ICD-10-CM | POA: Diagnosis not present

## 2023-12-20 DIAGNOSIS — K644 Residual hemorrhoidal skin tags: Secondary | ICD-10-CM

## 2023-12-20 DIAGNOSIS — R1013 Epigastric pain: Secondary | ICD-10-CM | POA: Diagnosis not present

## 2023-12-20 DIAGNOSIS — K2970 Gastritis, unspecified, without bleeding: Secondary | ICD-10-CM

## 2023-12-20 DIAGNOSIS — I10 Essential (primary) hypertension: Secondary | ICD-10-CM | POA: Insufficient documentation

## 2023-12-20 DIAGNOSIS — K649 Unspecified hemorrhoids: Secondary | ICD-10-CM

## 2023-12-20 DIAGNOSIS — E119 Type 2 diabetes mellitus without complications: Secondary | ICD-10-CM | POA: Diagnosis not present

## 2023-12-20 HISTORY — PX: COLONOSCOPY: SHX5424

## 2023-12-20 HISTORY — DX: Nausea with vomiting, unspecified: R11.2

## 2023-12-20 HISTORY — DX: Nausea with vomiting, unspecified: Z98.890

## 2023-12-20 HISTORY — PX: ESOPHAGOGASTRODUODENOSCOPY: SHX5428

## 2023-12-20 LAB — GLUCOSE, CAPILLARY: Glucose-Capillary: 91 mg/dL (ref 70–99)

## 2023-12-20 SURGERY — COLONOSCOPY
Anesthesia: General

## 2023-12-20 MED ORDER — PROPOFOL 500 MG/50ML IV EMUL
INTRAVENOUS | Status: DC | PRN
  Administered 2023-12-20: 70 mg via INTRAVENOUS
  Administered 2023-12-20: 150 ug/kg/min via INTRAVENOUS

## 2023-12-20 MED ORDER — SIMETHICONE 40 MG/0.6ML PO SUSP
ORAL | Status: DC | PRN
  Administered 2023-12-20: 120 mL

## 2023-12-20 MED ORDER — LIDOCAINE HCL (PF) 2 % IJ SOLN
INTRAMUSCULAR | Status: DC | PRN
Start: 2023-12-20 — End: 2023-12-20
  Administered 2023-12-20: 100 mg via INTRADERMAL

## 2023-12-20 MED ORDER — PANTOPRAZOLE SODIUM 40 MG PO TBEC: 40.0000 mg | DELAYED_RELEASE_TABLET | Freq: Two times a day (BID) | ORAL | 11 refills | Status: DC

## 2023-12-20 MED ORDER — LACTATED RINGERS IV SOLN: INTRAVENOUS | Status: DC | PRN

## 2023-12-20 MED ORDER — LACTATED RINGERS IV SOLN: INTRAVENOUS | Status: DC

## 2023-12-20 NOTE — Interval H&P Note (Signed)
 History and Physical Interval Note:  12/20/2023 11:43 AM  Karen Hebert  has presented today for surgery, with the diagnosis of rectal bleeding, anemia, epipgastric pain, refluc.  The various methods of treatment have been discussed with the patient and family. After consideration of risks, benefits and other options for treatment, the patient has consented to  Procedure(s) with comments: COLONOSCOPY (N/A) - 1230pm, asa 2 EGD (ESOPHAGOGASTRODUODENOSCOPY) (N/A) as a surgical intervention.  The patient's history has been reviewed, patient examined, no change in status, stable for surgery.  I have reviewed the patient's chart and labs.  Questions were answered to the patient's satisfaction.     Karen Hebert

## 2023-12-20 NOTE — Anesthesia Preprocedure Evaluation (Signed)
 Anesthesia Evaluation  Patient identified by MRN, date of birth, ID band Patient awake    Reviewed: Allergy & Precautions, H&P , NPO status , Patient's Chart, lab work & pertinent test results, reviewed documented beta blocker date and time   History of Anesthesia Complications (+) PONV and history of anesthetic complications  Airway Mallampati: II  TM Distance: >3 FB Neck ROM: full    Dental no notable dental hx.    Pulmonary neg pulmonary ROS   Pulmonary exam normal breath sounds clear to auscultation       Cardiovascular Exercise Tolerance: Good hypertension, negative cardio ROS  Rhythm:regular Rate:Normal     Neuro/Psych negative neurological ROS  negative psych ROS   GI/Hepatic negative GI ROS, Neg liver ROS,,,  Endo/Other  negative endocrine ROSdiabetes    Renal/GU negative Renal ROS  negative genitourinary   Musculoskeletal   Abdominal   Peds  Hematology negative hematology ROS (+)   Anesthesia Other Findings   Reproductive/Obstetrics negative OB ROS                             Anesthesia Physical Anesthesia Plan  ASA: 2  Anesthesia Plan: General   Post-op Pain Management:    Induction:   PONV Risk Score and Plan: Propofol  infusion  Airway Management Planned:   Additional Equipment:   Intra-op Plan:   Post-operative Plan:   Informed Consent: I have reviewed the patients History and Physical, chart, labs and discussed the procedure including the risks, benefits and alternatives for the proposed anesthesia with the patient or authorized representative who has indicated his/her understanding and acceptance.     Dental Advisory Given  Plan Discussed with: CRNA  Anesthesia Plan Comments:        Anesthesia Quick Evaluation

## 2023-12-20 NOTE — Transfer of Care (Signed)
 Immediate Anesthesia Transfer of Care Note  Patient: Karen Hebert  Procedure(s) Performed: COLONOSCOPY EGD (ESOPHAGOGASTRODUODENOSCOPY)  Patient Location: PACU and Short Stay  Anesthesia Type:General  Level of Consciousness: sedated  Airway & Oxygen Therapy: Patient Spontanous Breathing  Post-op Assessment: Report given to RN and Post -op Vital signs reviewed and stable  Post vital signs: Reviewed and stable  Last Vitals:  Vitals Value Taken Time  BP 110/66 12/20/23 1303  Temp 36.6 C 12/20/23 1303  Pulse 69 12/20/23 1303  Resp 16 12/20/23 1303  SpO2 98 % 12/20/23 1303    Last Pain:  Vitals:   12/20/23 1303  TempSrc: Oral  PainSc:       Patients Stated Pain Goal: 7 (12/20/23 1109)  Complications: No notable events documented.

## 2023-12-20 NOTE — Telephone Encounter (Signed)
 Can we refer this patient to [facility] to discuss her hemorrhoids?  Thank you

## 2023-12-20 NOTE — Op Note (Signed)
 [facility] Patient Name: Karen Hebert Procedure Date: 12/20/2023 11:43 AM MRN: [id number] Date of Birth: Jun 27, 1971 Attending MD: Rolando Cliche. Roel Clarity, [id number] CSN: [id number] Age: 53 Admit Type: Outpatient Procedure:                Colonoscopy Indications:              Iron deficiency anemia Providers:                Rolando Cliche. Mordechai April, DO, Willena Harp, [place]                            Wilson, Technician, Theola Fitch Referring MD:              Medicines:                See the Anesthesia note for documentation of the                            administered medications Complications:            No immediate complications. Estimated Blood Loss:     Estimated blood loss: none. Procedure:                Pre-Anesthesia Assessment:                           - The anesthesia plan was to use monitored                            anesthesia care (MAC).                           After obtaining informed consent, the colonoscope                            was passed under direct vision. Throughout the                            procedure, the patient's blood pressure, pulse, and                            oxygen saturations were monitored continuously. The                            [id number]-HQ190L ([id number]) scope was introduced through                            the anus and advanced to the the terminal ileum,                            with identification of the appendiceal orifice and                            IC valve. The colonoscopy was performed without                            difficulty. The patient tolerated the procedure  well. The quality of the bowel preparation was                            evaluated using the BBPS [facility] Bowel Preparation                            Scale) with scores of: Right Colon = 3, Transverse                            Colon = 3 and Left Colon = 3 (entire mucosa seen                            well with no  residual staining, small fragments of                            stool or opaque liquid). The total BBPS score                            equals 9. Scope In: 12:46:51 PM Scope Out: 1:00:45 PM Scope Withdrawal Time: 0 hours 11 minutes 25 seconds  Total Procedure Duration: 0 hours 13 minutes 54 seconds  Findings:      Hemorrhoids were found on perianal exam.      Non-bleeding internal hemorrhoids were found during retroflexion.      The terminal ileum appeared normal.      The entire examined colon appeared normal. Impression:               - Hemorrhoids found on perianal exam.                           - Non-bleeding internal hemorrhoids.                           - The examined portion of the ileum was normal.                           - The entire examined colon is normal.                           - No specimens collected. Moderate Sedation:      Per Anesthesia Care Recommendation:           - Patient has a contact number available for                            emergencies. The signs and symptoms of potential                            delayed complications were discussed with the                            patient. Return to normal activities tomorrow.                            Written discharge instructions were provided to  the                            patient.                           - Resume previous diet.                           - Continue present medications.                           - Await pathology results.                           - Repeat colonoscopy in 10 years for screening                            purposes.                           - Return to GI clinic in 8 weeks.                           - Consider surgery referral for hemorrhoidal disease Procedure Code(s):        --- Professional ---                           [id number], Colonoscopy, flexible; diagnostic, including                            collection of specimen(s) by brushing or washing,                             when performed (separate procedure) Diagnosis Code(s):        --- Professional ---                           K64.8, Other hemorrhoids                           D50.9, Iron deficiency anemia, unspecified CPT copyright 2022 American Medical Association. All rights reserved. The codes documented in this report are preliminary and upon coder review may  be revised to meet current compliance requirements. Rolando Cliche. Mordechai April, DO Rolando Cliche. Mordechai April, DO 12/20/2023 1:02:57 PM This report has been signed electronically. Number of Addenda: 0

## 2023-12-20 NOTE — Telephone Encounter (Signed)
 Referral sent

## 2023-12-20 NOTE — Op Note (Signed)
 [facility] Patient Name: Karen Hebert Procedure Date: 12/20/2023 11:44 AM MRN: [id number] Date of Birth: [birth year] Attending MD: Rolando Cliche. Mordechai April , [place], [id number] CSN: [id number] Age: 53 Admit Type: Outpatient Procedure:                Upper GI endoscopy Indications:              Heartburn Providers:                Rolando Cliche. Mordechai April, DO, Willena Harp, [place]                            Wilson, Technician, Theola Fitch Referring MD:              Medicines:                See the Anesthesia note for documentation of the                            administered medications Complications:            No immediate complications. Estimated Blood Loss:     Estimated blood loss was minimal. Procedure:                Pre-Anesthesia Assessment:                           - The anesthesia plan was to use monitored                            anesthesia care (MAC).                           After obtaining informed consent, the endoscope was                            passed under direct vision. Throughout the                            procedure, the patient's blood pressure, pulse, and                            oxygen saturations were monitored continuously. The                            [id number]-H190 ([id number]) scope was introduced through the                            mouth, and advanced to the second part of duodenum.                            The upper GI endoscopy was accomplished without                            difficulty. The patient tolerated the procedure                            well. Scope In: 12:38:56 PM  Scope Out: 12:42:27 PM Total Procedure Duration: 0 hours 3 minutes 31 seconds  Findings:      LA Grade A (one or more mucosal breaks less than 5 mm, not extending       between tops of 2 mucosal folds) esophagitis with no bleeding was found       at the gastroesophageal junction. Biopsies were taken with a cold       forceps for histology.      Patchy minimal  inflammation characterized by erythema was found in the       gastric body. Biopsies were taken with a cold forceps for Helicobacter       pylori testing.      The duodenal bulb, first portion of the duodenum and second portion of       the duodenum were normal. Impression:               - LA Grade A reflux esophagitis with no bleeding.                            Biopsied.                           - Gastritis. Biopsied.                           - Normal duodenal bulb, first portion of the                            duodenum and second portion of the duodenum. Moderate Sedation:      Per Anesthesia Care Recommendation:           - Patient has a contact number available for                            emergencies. The signs and symptoms of potential                            delayed complications were discussed with the                            patient. Return to normal activities tomorrow.                            Written discharge instructions were provided to the                            patient.                           - Resume previous diet.                           - Continue present medications.                           - Await pathology results.                           -  Use a proton pump inhibitor PO BID for 12 weeks. Procedure Code(s):        --- Professional ---                           [phone], Esophagogastroduodenoscopy, flexible,                            transoral; with biopsy, single or multiple Diagnosis Code(s):        --- Professional ---                           K21.00, Gastro-esophageal reflux disease with                            esophagitis, without bleeding                           K29.70, Gastritis, unspecified, without bleeding                           R12, Heartburn CPT copyright 2022 American Medical Association. All rights reserved. The codes documented in this report are preliminary and upon coder review may  be revised to meet current  compliance requirements. Rolando Cliche. Mordechai April, DO Rolando Cliche. Mordechai April, DO 12/20/2023 12:45:01 PM This report has been signed electronically. Number of Addenda: 0

## 2023-12-20 NOTE — Anesthesia Postprocedure Evaluation (Signed)
 Anesthesia Post Note  Patient: Karen Hebert  Procedure(s) Performed: COLONOSCOPY EGD (ESOPHAGOGASTRODUODENOSCOPY)  Patient location during evaluation: Phase II Anesthesia Type: General Level of consciousness: awake Pain management: pain level controlled Vital Signs Assessment: post-procedure vital signs reviewed and stable Respiratory status: spontaneous breathing and respiratory function stable Cardiovascular status: blood pressure returned to baseline and stable Postop Assessment: no headache and no apparent nausea or vomiting Anesthetic complications: no Comments: Late entry   No notable events documented.   Last Vitals:  Vitals:   12/20/23 1109 12/20/23 1303  BP: 124/85 110/66  Pulse: 69 69  Resp: 16 16  Temp: 36.8 C 36.6 C  SpO2: 98% 98%    Last Pain:  Vitals:   12/20/23 1303  TempSrc: Oral  PainSc:                  Coretha Dew

## 2023-12-20 NOTE — Discharge Instructions (Addendum)
 Colonoscopy Discharge Instructions  Read the instructions outlined below and refer to this sheet in the next few weeks. These discharge instructions provide you with general information on caring for yourself after you leave the hospital. Your doctor may also give you specific instructions. While your treatment has been planned according to the most current medical practices available, unavoidable complications occasionally occur.   ACTIVITY You may resume your regular activity, but move at a slower pace for the next 24 hours.  Take frequent rest periods for the next 24 hours.  Walking will help get rid of the air and reduce the bloated feeling in your belly (abdomen).  No driving for 24 hours (because of the medicine (anesthesia) used during the test).   Do not sign any important legal documents or operate any machinery for 24 hours (because of the anesthesia used during the test).  NUTRITION Drink plenty of fluids.  You may resume your normal diet as instructed by your doctor.  Begin with a light meal and progress to your normal diet. Heavy or fried foods are harder to digest and may make you feel sick to your stomach (nauseated).  Avoid alcoholic beverages for 24 hours or as instructed.  MEDICATIONS You may resume your normal medications unless your doctor tells you otherwise.  WHAT YOU CAN EXPECT TODAY Some feelings of bloating in the abdomen.  Passage of more gas than usual.  Spotting of blood in your stool or on the toilet paper.  IF YOU HAD POLYPS REMOVED DURING THE COLONOSCOPY: No aspirin products for 7 days or as instructed.  No alcohol for 7 days or as instructed.  Eat a soft diet for the next 24 hours.  FINDING OUT THE RESULTS OF YOUR TEST Not all test results are available during your visit. If your test results are not back during the visit, make an appointment with your caregiver to find out the results. Do not assume everything is normal if you have not heard from your  caregiver or the medical facility. It is important for you to follow up on all of your test results.  SEEK IMMEDIATE MEDICAL ATTENTION IF: You have more than a spotting of blood in your stool.  Your belly is swollen (abdominal distention).  You are nauseated or vomiting.  You have a temperature over 101.  You have abdominal pain or discomfort that is severe or gets worse throughout the day.   Your EGD revealed mild amount inflammation in your stomach as well as your esophagus (reflux esophagitis).  I took biopsies of this to rule out infection with a bacteria called H. pylori.  Await pathology results, my office will contact you.  Overall, your colon appeared very healthy.  I did not see any active inflammation indicative of underlying inflammatory bowel disease such as Crohn's disease or ulcerative colitis throughout your colon or end portion of your small bowel.    I did not find any polyps or evidence of colon cancer.  I recommend repeating colonoscopy in 10 years for colon cancer screening purposes.    You do have significant external hemorrhoids as well as small internal hemorrhoids which is likely the cause of your bleeding.  Recommend increase fiber in your diet.  Limit toilet time to less than 5 minutes.  Sitz bath's as needed.  Follow-up with GI office in 8 weeks. Office will call with appointment   I hope you have a great rest of your week!  Rolando Cliche. Mordechai April, D.O. [facility]

## 2023-12-21 ENCOUNTER — Encounter (HOSPITAL_COMMUNITY): Payer: Self-pay | Admitting: Internal Medicine

## 2023-12-21 LAB — SURGICAL PATHOLOGY

## 2024-01-04 ENCOUNTER — Encounter: Payer: Self-pay | Admitting: Nurse Practitioner

## 2024-01-04 ENCOUNTER — Telehealth: Admitting: Nurse Practitioner

## 2024-01-04 VITALS — BP 151/80 | HR 71 | Ht 69.0 in | Wt 201.0 lb

## 2024-01-04 DIAGNOSIS — R635 Abnormal weight gain: Secondary | ICD-10-CM

## 2024-01-04 DIAGNOSIS — Z7985 Long-term (current) use of injectable non-insulin antidiabetic drugs: Secondary | ICD-10-CM

## 2024-01-04 DIAGNOSIS — E1159 Type 2 diabetes mellitus with other circulatory complications: Secondary | ICD-10-CM | POA: Diagnosis not present

## 2024-01-04 DIAGNOSIS — E669 Obesity, unspecified: Secondary | ICD-10-CM

## 2024-01-04 DIAGNOSIS — E1169 Type 2 diabetes mellitus with other specified complication: Secondary | ICD-10-CM

## 2024-01-04 DIAGNOSIS — Z7984 Long term (current) use of oral hypoglycemic drugs: Secondary | ICD-10-CM

## 2024-01-04 DIAGNOSIS — E1165 Type 2 diabetes mellitus with hyperglycemia: Secondary | ICD-10-CM

## 2024-01-04 DIAGNOSIS — R110 Nausea: Secondary | ICD-10-CM | POA: Diagnosis not present

## 2024-01-04 DIAGNOSIS — E785 Hyperlipidemia, unspecified: Secondary | ICD-10-CM

## 2024-01-04 DIAGNOSIS — I152 Hypertension secondary to endocrine disorders: Secondary | ICD-10-CM

## 2024-01-04 MED ORDER — ONDANSETRON 4 MG PO TBDP: 4.0000 mg | ORAL_TABLET | Freq: Three times a day (TID) | ORAL | 2 refills | Status: AC | PRN

## 2024-01-04 MED ORDER — AMLODIPINE BESYLATE 5 MG PO TABS: 10.0000 mg | ORAL_TABLET | Freq: Every day | ORAL | 3 refills | Status: AC

## 2024-01-04 NOTE — Progress Notes (Signed)
 Virtual Visit Encounter mychart visit.   I connected with  Aris Bel on 01/24/24 at 11:00 AM EDT by secure video and audio telemedicine application. I verified that I am speaking with the correct person using two identifiers.   I introduced myself as a Publishing rights manager with the practice. The limitations of evaluation and management by telemedicine discussed with the patient and the availability of in person appointments. The patient expressed verbal understanding and consent to proceed.  Participating parties in this visit include: Myself and patient  The patient is: Patient Location: Other:  work I am: Provider Location: Office/Clinic Subjective:    CC and HPI: Karen Hebert is a 53 y.o. year old female presenting for follow up of weight management.  History of Present Illness Karen Hebert is a 53 year old female with type 2 diabetes and hypertension who presents for a medication review and follow-up on weight management.  Her current medication regimen includes Mounjaro  and metformin , effectively managing her blood sugar levels, which typically range between 90 and 100 mg/dL, with postprandial levels reaching 114 mg/dL. She started Mounjaro  around September and has since lost almost 40 pounds, improving her overall health, including blood pressure and liver function.  She experienced adverse effects from a previous blood pressure medication combination, valsartan  and hydrochlorothiazide, causing her skin to feel sunburned. She has since reverted to taking 5 mg of amlodipine  alone, which manages her blood pressure better, with recent readings around 143/81 mmHg. She takes metformin  as three pills before dinner, which she confirms is effective.  She practices intermittent fasting, eating dinner and then not eating again until lunch the next day, aiding in her weight loss. She reports occasional nausea after taking Mounjaro , which she manages with sour gummy candies,  although she is out of her prescribed nausea medication.  She is scheduled to see a surgeon for severe hemorrhoids, following a colonoscopy and endoscopy that showed her colon was in good condition. Surgical intervention was recommended due to the severity of her hemorrhoids.  She monitors her blood sugar at least three times a day and her blood pressure twice a day, adjusting her activities as needed to maintain control. She is currently on 10 mg of Mounjaro  and is satisfied with her current weight loss trajectory, which she describes as gradual and sustainable.  She works in [place]  but lives in [place] , and she [facility] her [facility] in [place] on her way home from work.  Past medical history, Surgical history, Family history not pertinant except as noted below, Social history, Allergies, and medications have been entered into the medical record, reviewed, and corrections made.   Review of Systems:  All review of systems negative except what is listed in the HPI  Objective:    Alert and oriented x 4 Speaking in clear sentences with no shortness of breath. No distress.  Impression and Recommendations:    Problem List Items Addressed This Visit     Type 2 diabetes mellitus with hyperglycemia, without long-term current use of insulin  (HCC) - Primary (Chronic)   Type 2 diabetes is well-controlled with Mounjaro  and metformin . Blood sugar levels range from 90 to 114 mg/dL. Weight loss of nearly 40 pounds since starting Mounjaro  in September has improved glycemic control. No significant side effects from metformin , but occasional nausea after Mounjaro  administration, managed with sour gummy candies. She practices intermittent fasting, aiding in weight management. - Continue Mounjaro  10 mg as currently prescribed. - Continue metformin , taking  three pills before dinner. - Monitor blood sugar levels at least three times daily. - [facility] [place]  [facility].      Relevant Medications   amLODipine  (NORVASC ) 5 MG tablet   Hypertension associated with diabetes (HCC)   Hypertension managed with amlodipine  5 mg. Blood pressure averages 143/81 mmHg, above the target of 120/80 mmHg. Previous combination therapy with valsartan  and hydrochlorothiazide caused severe skin sensitivity, leading to discontinuation. Current monotherapy with amlodipine  is better tolerated. Weight loss is anticipated to further improve blood pressure control. - Increase amlodipine  to 10 mg daily by taking two 5 mg tablets. - Monitor blood pressure regularly. - Adjust amlodipine  dosage as needed, with the option to cut tablets for a 7.5 mg dose if necessary. - [facility] [place].      Relevant Medications   amLODipine  (NORVASC ) 5 MG tablet   Obesity (BMI 30-39.9)    >>ASSESSMENT AND PLAN FOR WEIGHT GAIN WRITTEN ON 01/24/2024  8:02 AM BY Dealva Lafoy E, NP  Doing fabulous with diet and exercise management along with Mounjaro . No adverse effects. Will plan to continue current treatment.       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 Will monitor labs at next visit. Low fat diet options recommended.       Relevant Medications   amLODipine  (NORVASC ) 5 MG tablet   Nausea   Relevant Medications   ondansetron  (ZOFRAN -ODT) 4 MG disintegrating tablet   Other Visit Diagnoses       Weight gain       Relevant Medications   amLODipine  (NORVASC ) 5 MG tablet       current treatment plan is effective, no change in therapy, orders and follow up as documented in EMR I discussed the assessment and treatment plan with the patient. The patient was provided an opportunity  to ask questions and all were answered. The patient agreed with the plan and demonstrated an understanding of the instructions.   The patient was advised to call back or seek an in-person evaluation if the symptoms worsen or if the condition fails to improve as anticipated.  Follow-Up: in 6 months  I provided 24 minutes of non-face-to-face interaction with this non face-to-face encounter including intake, same-day documentation, and chart review.   Annella Kief, NP , DNP, AGNP-c [facility]

## 2024-01-05 ENCOUNTER — Ambulatory Visit (INDEPENDENT_AMBULATORY_CARE_PROVIDER_SITE_OTHER): Admitting: Surgery

## 2024-01-05 ENCOUNTER — Encounter: Payer: Self-pay | Admitting: Surgery

## 2024-01-05 VITALS — BP 115/81 | HR 70 | Temp 97.6°F | Resp 14 | Ht 69.0 in | Wt 206.0 lb

## 2024-01-05 DIAGNOSIS — K644 Residual hemorrhoidal skin tags: Secondary | ICD-10-CM

## 2024-01-05 NOTE — Patient Instructions (Signed)
-  Hemorrhoid surgery for external hemorrhoids is very painful.  -The pain and discomfort that the patient is having currently will be magnified after the surgery for at least 2-3 weeks.   -Will have feelings of constant pressure and pain in the area from the swelling and removal of the anoderm (skin around the anus).  -The internal hemorrhoids are not painful to remove because the same nerves are not involved, and the sensation is different, but removal of any external hemorrhoids will cause significant discomfort.  -Will need at least 4-6 weeks to recover from the surgery, and should not expect to be able to feel back to "normal for 6-8 weeks."    Risks of hemorrhoid surgery include bleeding, risk of infection although rare, and the risk of narrowing the anal canal if too much tissue is removed. Given this risk, it is likely that only the 2-3 largest hemorrhoid columns would be removed during the initial surgery.  There is a risk of incontinence after surgery if the muscles were injured, and although this is rare that it can happen and is another reason to limit the amount of hemorrhoids removed.    - Use Americaine ointment as needed for pain.  May apply over hemorrhoid and anus - Continue to use over the counter suppositories for acute pain/inflammation - Take Sitz baths (shallow warm water  baths) for comfort and after bowel movements.  - Use wet wipes after bowel movements - Please keep your stools soft and take fiber daily (metamucil) and colace (over the counter) to help prevent constipation.

## 2024-01-06 ENCOUNTER — Telehealth: Admitting: Nurse Practitioner

## 2024-01-06 NOTE — Progress Notes (Unsigned)
 [facility] History and Physical  Reason for Referral: Hemorrhoids Referring Physician: Dr. Mordechai April  Chief Complaint   New Patient (Initial Visit)     Karen Hebert is a 53 y.o. female.  HPI: Patient presents for evaluation of hemorrhoids.  She notes that she has bleeding with bowel movements, especially when she is more constipated.  When she does have these episodes of bleeding, she will need to wear a pad, as she has a fair amount of bleeding that would soak through her underwear.  She also normally has pain after bowel movements.  She has a history of severe constipation which has required admissions for her constipation.  She recently was started on Linzess , which has significantly improved her constipation.  She now is having regular bowel movements without constipation.  She has noted significantly less bleeding after bowel movements since her constipation improved.  She also feels that her bowel movements have been more normal after being started on metformin .  She has not ever had any treatment for her hemorrhoids in the past other than lidocaine  cream and suppositories.  She is not currently using any creams or suppositories for her hemorrhoids.  She also complains of a right lateral chest wall lump.  She has not had a mammogram in 10 years.  She is scheduled to see her primary care doctor in June.  Her past medical history significant for diabetes and hypertension.  She denies use of blood thinning medications.  Her surgical history significant for an open appendectomy and a laparoscopic cholecystectomy.  She denies use of tobacco products, alcohol, and illicit drugs.  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   Transaminasemia 01/13/2023    Past Surgical History:  Procedure Laterality Date   APPENDECTOMY     CHOLECYSTECTOMY     COLONOSCOPY N/A 12/20/2023   Procedure: COLONOSCOPY;  Surgeon: Vinetta Greening, DO;  [facility];  Service: Endoscopy;  Laterality: N/A;  1230pm, asa 2   ESOPHAGOGASTRODUODENOSCOPY N/A 12/20/2023   Procedure: EGD (ESOPHAGOGASTRODUODENOSCOPY);  Surgeon: Vinetta Greening, DO;  Location: [facility];  Service: Endoscopy;  Laterality: N/A;    Family History  Adopted: Yes  Family history unknown: Yes    Social History   Tobacco Use   Smoking status: Never   Smokeless tobacco: Never  Vaping Use   Vaping status: Never Used  Substance Use Topics   Alcohol use: Not Currently   Drug use: Not Currently    Medications: I have reviewed the patient's current medications. Allergies as of 01/05/2024       Reactions   Penicillins Anaphylaxis        Medication List        Accurate as of Jan 05, 2024 11:59 PM. If you have any questions, ask your nurse or doctor.          amLODipine  5 MG tablet Commonly known as: NORVASC  Take 2 tablets (10 mg total) by mouth daily.   cholecalciferol 25 MCG (1000 UNIT) tablet Commonly known as: VITAMIN D3 Take 1,000 Units  by mouth daily.   metFORMIN  500 MG 24 hr tablet Commonly known as: GLUCOPHAGE -XR Take 3 tablets (1,500 mg total) by mouth daily. Take one at dinner may increase to 2 at dinner after 1 week then may increase to 3 at dinner after another week and no issues with the medicine.   Mounjaro  10 MG/0.5ML Pen Generic drug: tirzepatide  INJECT 10 MG UNDER THE SKIN WEEKLY   multivitamin with minerals Tabs tablet Take 1 tablet by mouth daily.   ondansetron  4 MG disintegrating tablet Commonly known as: ZOFRAN -ODT Take 1 tablet (4 mg total) by mouth  every 8 (eight) hours as needed for nausea or vomiting.   pantoprazole  40 MG tablet Commonly known as: Protonix  Take 1 tablet (40 mg total) by mouth 2 (two) times daily.         ROS:  Constitutional: negative for chills, fatigue, and fevers Eyes: negative for visual disturbance and pain Ears, nose, mouth, throat, and face: negative for ear drainage, sore throat, and sinus problems Respiratory: negative for cough, wheezing, and shortness of breath Cardiovascular: negative for chest pain and palpitations Gastrointestinal: negative for abdominal pain, nausea, reflux symptoms, and vomiting Genitourinary:negative for dysuria and frequency Integument/breast: negative for dryness and rash Hematologic/lymphatic: negative for bleeding and lymphadenopathy Musculoskeletal:negative for back pain and neck pain Neurological: negative for dizziness and tremors Endocrine: negative for temperature intolerance  Blood pressure 115/81, pulse 70, temperature 97.6 F (36.4 C), temperature source Oral, resp. rate 14, height 5\' 9"  (1.753 m), weight 206 lb (93.4 kg), SpO2 96%. Physical Exam Vitals reviewed.  Constitutional:      Appearance: Normal appearance.  HENT:     Head: Normocephalic and atraumatic.  Eyes:     Extraocular Movements: Extraocular movements intact.     Pupils: Pupils are equal, round, and reactive to light.  Cardiovascular:     Rate and Rhythm: Normal rate and regular rhythm.  Pulmonary:     Effort: Pulmonary effort is normal.     Breath sounds: Normal breath sounds.  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     Palpations: Abdomen is soft.     Tenderness: There is no abdominal tenderness.  Genitourinary:    Comments: Circumferential external hemorrhoids with associated skin tags, very small internal hemorrhoids noted on digital rectal  exam, no gross blood, no evidence of anal fissure Musculoskeletal:        General: Normal range of motion.     Cervical back: Normal range of motion.  Skin:    General: Skin is warm and dry.  Neurological:     General: No focal deficit present.     Mental Status: She is alert and oriented to person, place, and time.  Psychiatric:        Mood and Affect: Mood normal.        Behavior: Behavior normal.     Results: No results found for this or any previous visit (from the past 48 hours).  No results found.   Assessment & Plan:  Karen Hebert is a 53 y.o. female who presents for evaluation of hemorrhoids  -Hemorrhoid surgery for external hemorrhoids is very painful. The pain and discomfort that the patient is having currently will be magnified after the surgery for at least 2-3 weeks.  The patient will have feelings of constant pressure and pain in the  area from the swelling and removal of the anoderm (skin around the anus). The internal hemorrhoids are not painful to remove because the same nerves are not involved, and the sensation is different, but removal of any external hemorrhoids will cause significant discomfort. They will need at least 4-6 weeks to recover from the surgery, and should not expect to be able to feel back to "normal for 6-8 weeks."   -The risk of hemorrhoid surgery include bleeding, risk of infection although rare, and the risk of narrowing the anal canal if too much tissue is removed. Given this risk, it is likely that only the 2 largest hemorrhoid columns would be removed during the initial surgery.  We have also discussed the risk of incontinence after surgery if the muscles were injured, and although this is rare that it can happen and is another reason to limit the amount of hemorrhoids removed.   -Patient would like to think about her options prior to scheduling any surgeries -Advised that she can use topical agents and over-the-counter suppositories as needed  for pain and inflammation -Information provided to the patient regarding hemorrhoids -I also discussed the importance of the patient undergoing mammography for screening for breast cancer.  We discussed that the right lateral chest lipoma is not within her breast or her axilla, however she should still undergo screening for breast cancer.  She is scheduled to see her primary care doctor in June -Call or follow-up as needed  All questions were answered to the satisfaction of the patient.  Note: Portions of this report may have been transcribed using voice recognition software. Every effort has been made to ensure accuracy; however, inadvertent computerized transcription errors may still be present.   Lidia Reels, DO [facility] [address] [place], [place] [address] [phone] (office)

## 2024-01-24 NOTE — Assessment & Plan Note (Signed)
 Not currently on statin therapy, but  working on diet and exercise and utilizing mounjaro  for diabetes management. Will monitor labs at next visit. Low fat diet options recommended.

## 2024-01-24 NOTE — Assessment & Plan Note (Signed)
 Hypertension managed with amlodipine  5 mg. Blood pressure averages 143/81 mmHg, above the target of 120/80 mmHg. Previous combination therapy with valsartan  and hydrochlorothiazide caused severe skin sensitivity, leading to discontinuation. Current monotherapy with amlodipine  is better tolerated. Weight loss is anticipated to further improve blood pressure control. - Increase amlodipine  to 10 mg daily by taking two 5 mg tablets. - Monitor blood pressure regularly. - Adjust amlodipine  dosage as needed, with the option to cut tablets for a 7.5 mg dose if necessary. - [facility] [place]

## 2024-01-24 NOTE — Assessment & Plan Note (Signed)
>>  ASSESSMENT AND PLAN FOR WEIGHT GAIN WRITTEN ON 01/24/2024  8:02 AM BY Aleathia Purdy E, NP  Doing fabulous with diet and exercise management along with Mounjaro . No adverse effects. Will plan to continue current treatment.

## 2024-01-24 NOTE — Assessment & Plan Note (Signed)
 Doing fabulous with diet and exercise management along with Mounjaro . No adverse effects. Will plan to continue current treatment.

## 2024-01-24 NOTE — Assessment & Plan Note (Signed)
 Type 2 diabetes is well-controlled with Mounjaro  and metformin . Blood sugar levels range from 90 to 114 mg/dL. Weight loss of nearly 40 pounds since starting Mounjaro  in September has improved glycemic control. No significant side effects from metformin , but occasional nausea after Mounjaro  administration, managed with sour gummy candies. She practices intermittent fasting, aiding in weight management. - Continue Mounjaro  10 mg as currently prescribed. - Continue metformin , taking three pills before dinner. - Monitor blood sugar levels at least three times daily. - [facility] [place] [facility].

## 2024-02-15 ENCOUNTER — Ambulatory Visit: Admitting: Gastroenterology

## 2024-02-16 ENCOUNTER — Other Ambulatory Visit: Payer: Self-pay

## 2024-02-16 ENCOUNTER — Telehealth: Payer: Self-pay | Admitting: Nurse Practitioner

## 2024-02-16 MED ORDER — METFORMIN HCL ER 500 MG PO TB24
1500.0000 mg | ORAL_TABLET | Freq: Every day | ORAL | 1 refills | Status: AC
Start: 2024-02-16 — End: ?

## 2024-02-16 NOTE — Telephone Encounter (Signed)
 Fax refill request [facility]  Metformin  500

## 2024-02-21 ENCOUNTER — Encounter: Admitting: Nurse Practitioner

## 2024-03-10 IMAGING — CT CT HEAD W/O CM
4 series · 16 of 47 positions shown, 18 images · non-contrast
Comparison: Prior head CT 12/02/2020.

CLINICAL DATA: Provided history: Neuro deficit, acute, stroke
suspected. Additional history provided: Patient reports noting a
slight facial droop 2 days ago. History of diabetes and
hypertension.



[Series 2: head w o · axial · 0.44mm/px · z∈[+35,+155]mm · 7 of 32 slices shown, 9 images]
[im 4/32  brain]
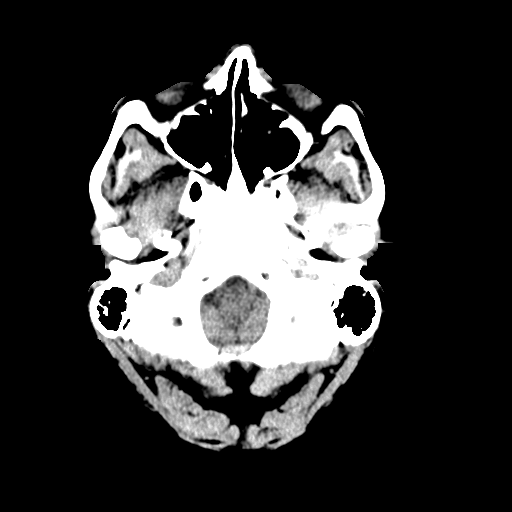
[im 4/32  bone]
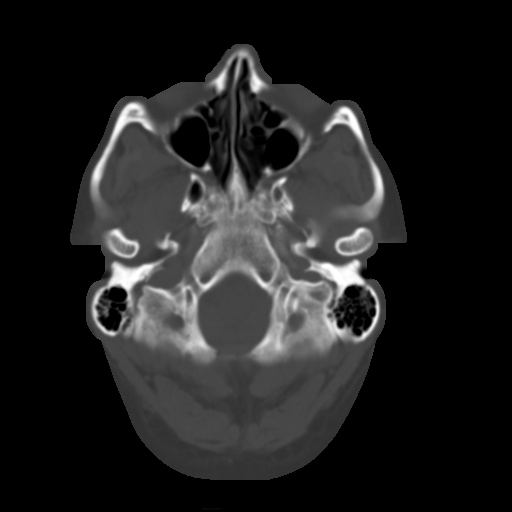
[im 8/32  brain]
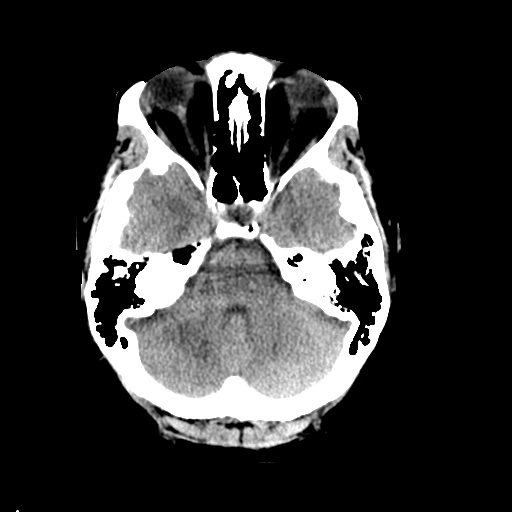
[im 12/32  brain]
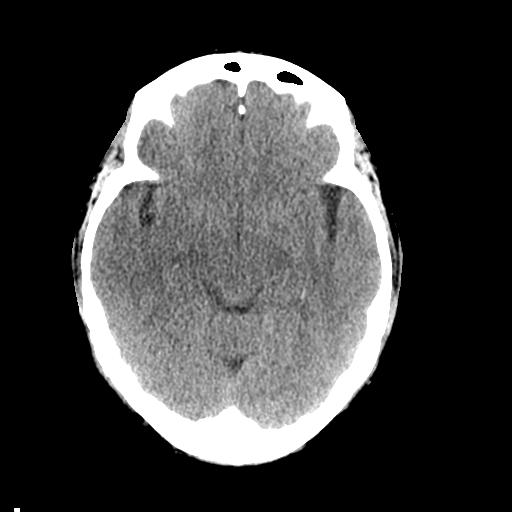
[im 16/32  brain]
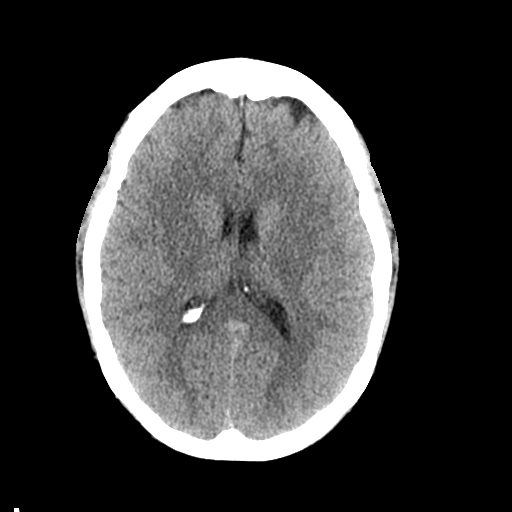
[im 20/32  brain]
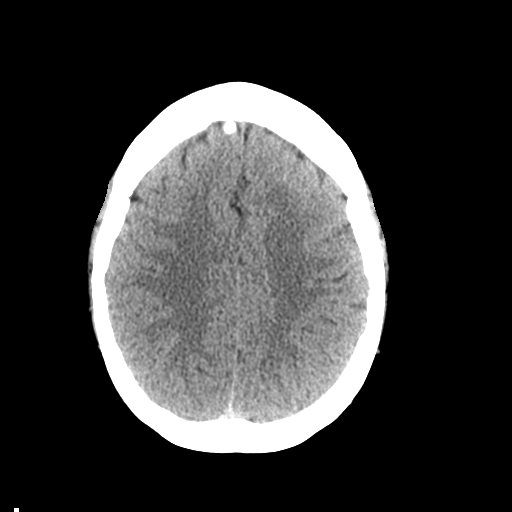
[im 20/32  bone]
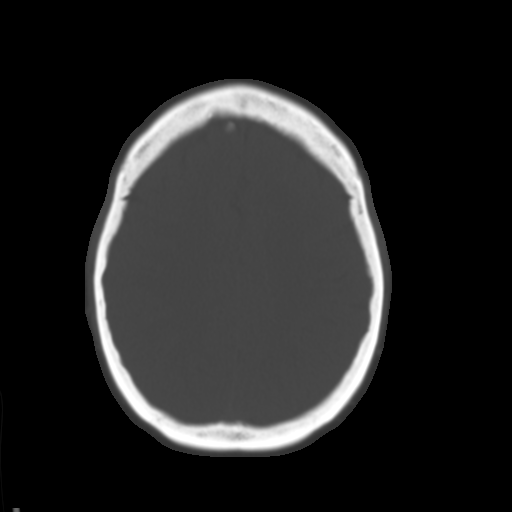
[im 24/32  brain]
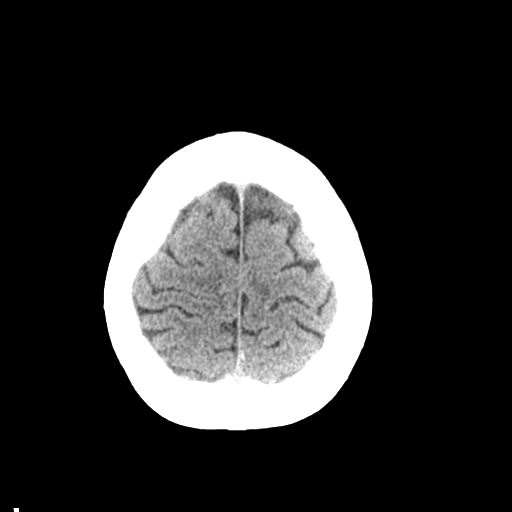
[im 28/32  brain]
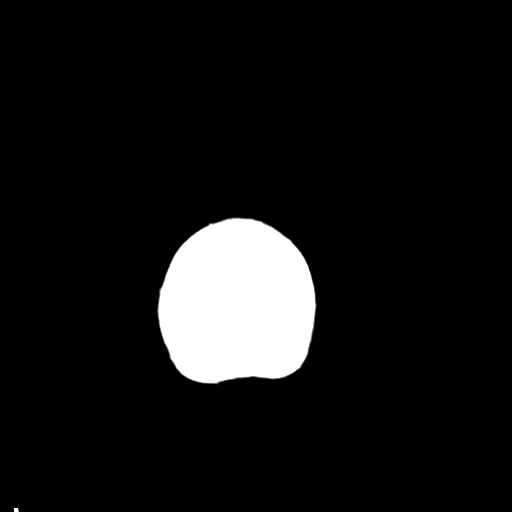

[Series 3: head bone · axial · 0.44mm/px · z∈[+34,+66]mm · 3 of 80 slices shown]
[im 8/80  bone]
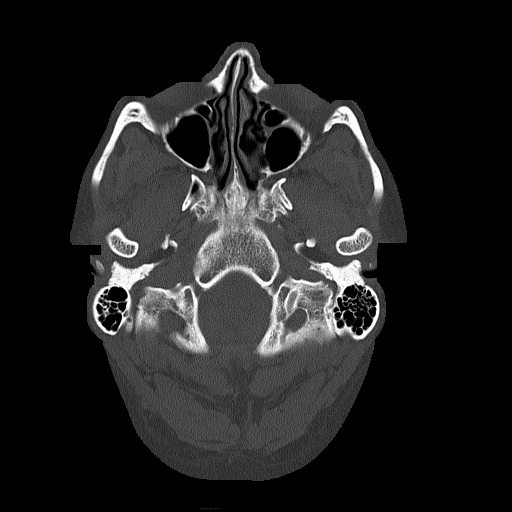
[im 16/80  bone]
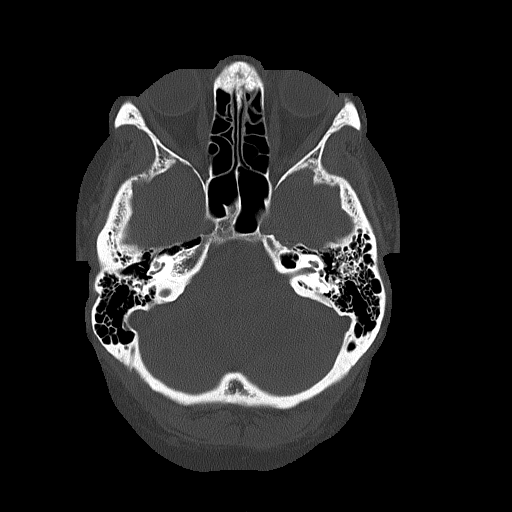
[im 24/80  bone]
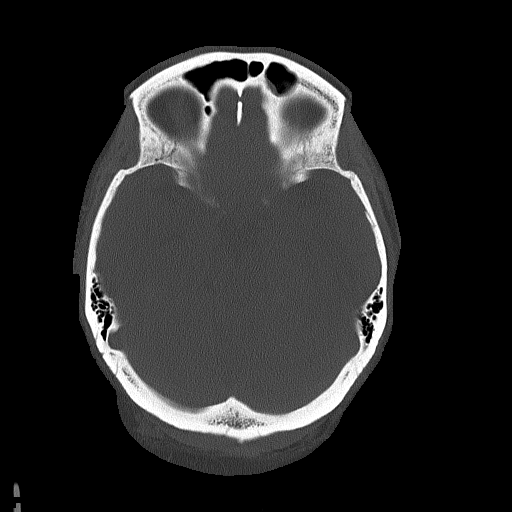

[Series 4: coronal soft · coronal · 0.34mm/px · 3 of 71 slices shown]
[im 24/71  brain]
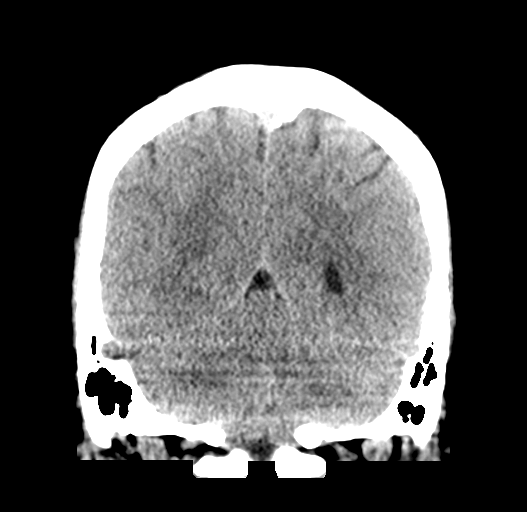
[im 32/71  brain]
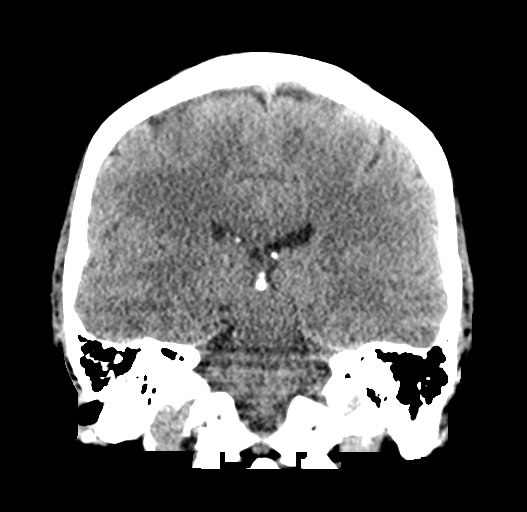
[im 39/71  brain]
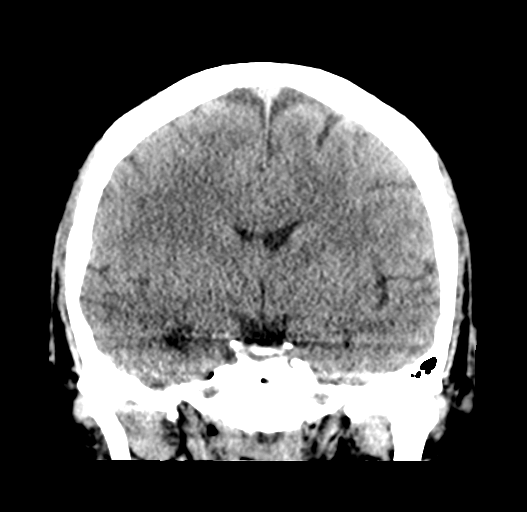

[Series 5: sagittal soft · sagittal · 0.34mm/px · 3 of 61 slices shown]
[im 21/61  brain]
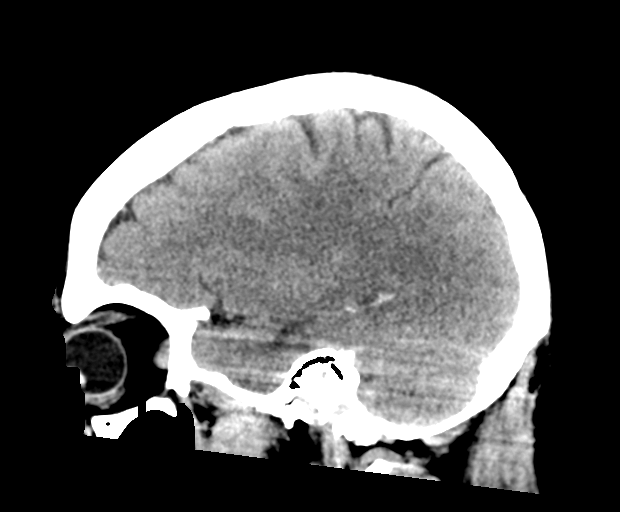
[im 31/61  brain]
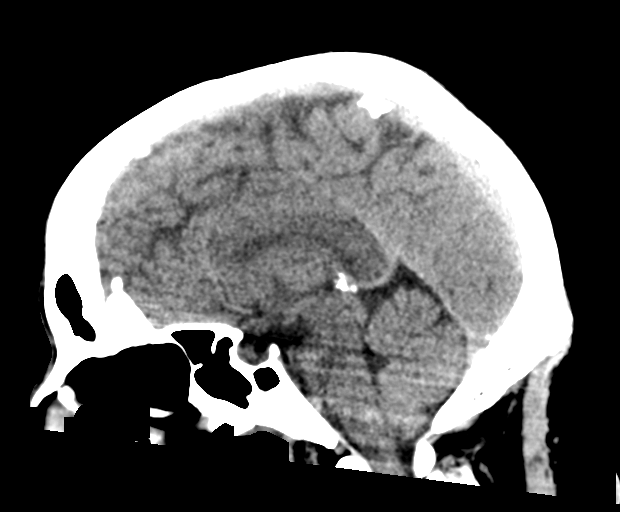
[im 41/61  brain]
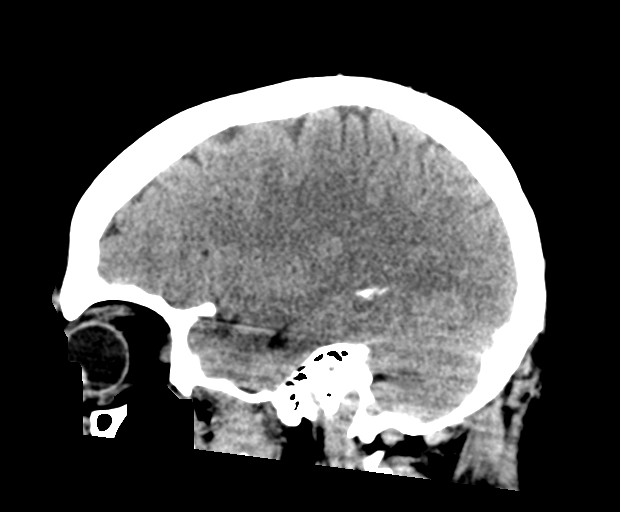

[16 of 47 positions shown; findings below may reference images not displayed]

FINDINGS: Brain:

Cerebral volume is normal.

There is no acute intracranial hemorrhage.

No demarcated cortical infarct.

No extra-axial fluid collection.

No evidence of an intracranial mass.

No midline shift.

Vascular: No hyperdense vessel.

Skull: Normal. Negative for fracture or focal lesion.

Sinuses/Orbits: Visualized orbits show no acute finding. Mild
mucosal thickening within the left frontal and ethmoid sinuses.
IMPRESSION: No evidence of acute intracranial abnormality.

Mild mucosal thickening within the left frontal and ethmoid sinuses.

## 2024-03-26 ENCOUNTER — Other Ambulatory Visit: Payer: Self-pay | Admitting: Nurse Practitioner

## 2024-03-26 DIAGNOSIS — E669 Obesity, unspecified: Secondary | ICD-10-CM

## 2024-03-26 DIAGNOSIS — I152 Hypertension secondary to endocrine disorders: Secondary | ICD-10-CM

## 2024-03-26 DIAGNOSIS — E782 Mixed hyperlipidemia: Secondary | ICD-10-CM

## 2024-03-26 DIAGNOSIS — E1165 Type 2 diabetes mellitus with hyperglycemia: Secondary | ICD-10-CM

## 2024-04-16 ENCOUNTER — Other Ambulatory Visit (HOSPITAL_COMMUNITY): Payer: Self-pay

## 2024-04-16 ENCOUNTER — Other Ambulatory Visit: Payer: Self-pay | Admitting: Nurse Practitioner

## 2024-04-16 ENCOUNTER — Telehealth: Payer: Self-pay

## 2024-04-16 DIAGNOSIS — E669 Obesity, unspecified: Secondary | ICD-10-CM

## 2024-04-16 DIAGNOSIS — E1165 Type 2 diabetes mellitus with hyperglycemia: Secondary | ICD-10-CM

## 2024-04-16 DIAGNOSIS — I152 Hypertension secondary to endocrine disorders: Secondary | ICD-10-CM

## 2024-04-16 DIAGNOSIS — E782 Mixed hyperlipidemia: Secondary | ICD-10-CM

## 2024-04-16 MED ORDER — MOUNJARO 10 MG/0.5ML ~~LOC~~ SOAJ: 10.0000 mg | SUBCUTANEOUS | 1 refills | Status: DC

## 2024-04-16 NOTE — Telephone Encounter (Signed)
 Copied from CRM [id number]. Topic: Clinical - Medication Prior Auth >> Apr 16, 2024  8:52 AM Graeme ORN wrote: Reason for CRM: [employer] called [place] required for tirzepatide  (MOUNJARO ) 10 MG/0.5ML Pen Exp 02/22/2024 [email or web address] or Fax: [phone]

## 2024-04-16 NOTE — Telephone Encounter (Signed)
 Copied from CRM #[id number]. Topic: Clinical - Medication Refill >> Apr 16, 2024  8:09 AM Suzen RAMAN wrote: Medication: tirzepatide  (MOUNJARO ) 10 MG/0.5ML Pen  Has the patient contacted their pharmacy? Yes  This is the patient's preferred pharmacy:   [facility] - [place], [place] - [address] [place] [address] Phone: [phone] Fax: [phone]  Is this the correct pharmacy for this prescription? Yes If no, delete pharmacy and type the correct one.   Has the prescription been filled recently? No  Is the patient out of the medication? Yes  Has the patient been seen for an appointment in the last year OR does the patient have an upcoming appointment? Yes  Can we respond through MyChart? Yes  Agent: Please be advised that Rx refills may take up to 3 business days. We ask that you follow-up with your pharmacy.

## 2024-05-31 ENCOUNTER — Ambulatory Visit: Admitting: Nurse Practitioner

## 2024-05-31 ENCOUNTER — Encounter: Payer: Self-pay | Admitting: Nurse Practitioner

## 2024-05-31 VITALS — BP 126/82 | HR 84 | Wt 202.2 lb

## 2024-05-31 DIAGNOSIS — R011 Cardiac murmur, unspecified: Secondary | ICD-10-CM

## 2024-05-31 DIAGNOSIS — E1165 Type 2 diabetes mellitus with hyperglycemia: Secondary | ICD-10-CM | POA: Diagnosis not present

## 2024-05-31 DIAGNOSIS — R4189 Other symptoms and signs involving cognitive functions and awareness: Secondary | ICD-10-CM | POA: Diagnosis not present

## 2024-05-31 DIAGNOSIS — E559 Vitamin D deficiency, unspecified: Secondary | ICD-10-CM

## 2024-05-31 DIAGNOSIS — R5382 Chronic fatigue, unspecified: Secondary | ICD-10-CM

## 2024-05-31 DIAGNOSIS — R062 Wheezing: Secondary | ICD-10-CM

## 2024-05-31 DIAGNOSIS — G4700 Insomnia, unspecified: Secondary | ICD-10-CM | POA: Diagnosis not present

## 2024-05-31 DIAGNOSIS — R6889 Other general symptoms and signs: Secondary | ICD-10-CM

## 2024-05-31 DIAGNOSIS — I152 Hypertension secondary to endocrine disorders: Secondary | ICD-10-CM

## 2024-05-31 DIAGNOSIS — E669 Obesity, unspecified: Secondary | ICD-10-CM

## 2024-05-31 DIAGNOSIS — E1169 Type 2 diabetes mellitus with other specified complication: Secondary | ICD-10-CM

## 2024-05-31 DIAGNOSIS — E1159 Type 2 diabetes mellitus with other circulatory complications: Secondary | ICD-10-CM

## 2024-05-31 DIAGNOSIS — E782 Mixed hyperlipidemia: Secondary | ICD-10-CM

## 2024-05-31 DIAGNOSIS — N951 Menopausal and female climacteric states: Secondary | ICD-10-CM

## 2024-05-31 DIAGNOSIS — R232 Flushing: Secondary | ICD-10-CM | POA: Insufficient documentation

## 2024-05-31 MED ORDER — ALBUTEROL SULFATE HFA 108 (90 BASE) MCG/ACT IN AERS: 2.0000 | INHALATION_SPRAY | Freq: Four times a day (QID) | RESPIRATORY_TRACT | 3 refills | Status: AC | PRN

## 2024-05-31 MED ORDER — TRAZODONE HCL 50 MG PO TABS: 25.0000 mg | ORAL_TABLET | Freq: Every evening | ORAL | 6 refills | Status: DC | PRN

## 2024-05-31 NOTE — Patient Instructions (Addendum)
 I have placed an order for the echocardiogram. The imaging center will contact you to schedule this.   I sent in the albuterol for you to try when you are having the shortness of breath. If this is not helpful, please let me know.   I will let you know what the labs show and make recommendations based on these findings.   I sent the Trazodone in for you to try for bedtime/sleep.

## 2024-05-31 NOTE — Progress Notes (Signed)
 Catheline Doing, DNP, AGNP-c [facility]  [address] [place], [place] [address] [phone]  ESTABLISHED PATIENT- Chronic Health and/or Follow-Up Visit on 05/31/2024  Blood pressure 126/82, pulse 84, weight 202 lb 3.2 oz (91.7 kg).   HPI: History of Present Illness Karen Hebert is a 53 year old female who presents with menopausal symptoms, including sleep disturbances and hot flashes.  She has been experiencing menopausal symptoms that have intensified over the past six to eight months, including frequent hot flashes, mood swings, and significant sleep disturbances. She wakes up every hour due to feeling excessively hot, whereas previously she would wake up around 3:30 AM to use the bathroom due to high water  intake. Now, she wakes up frequently without needing to urinate, attributing it to the heat.  She underwent an ablation in her late thirties due to severe clotting issues, which resolved her menstrual periods. Consequently, she is uncertain about the onset of menopause. There is no family history of menopause due to lack of information about her biological family.  She experiences muscle cramps at night, which she attributes to her high water  intake and possible electrolyte imbalance. Eating a pickle before bed seems to alleviate the cramps. She also notes a sensitivity in her right side, possibly related to her kidney, but suspects it might be muscular.  She has a history of asthma-like symptoms during hot and humid conditions, feeling winded and nearly blacking out. She recalls a similar experience during a company picnic at [employer], where she had to stop frequently due to the heat and humidity.  Her current medications include Mounjaro  and metformin , which are effective in managing her blood sugar levels. She experiences occasional nausea with Mounjaro  but finds it manageable. She has noticed a decrease in sugar cravings since the dosage adjustment.  She  mentions a past issue with red meat consumption causing nausea, which she manages by avoiding heavy meals late in the day. She no longer has a gallbladder and has noticed that eating red meat, especially later in the day, causes her to feel nauseated, so she avoids heavy meals late in the day.  All ROS negative with exception of what is listed above.   PHYSICAL EXAM Physical Exam Vitals and nursing note reviewed.  Constitutional:      Appearance: Normal appearance.  HENT:     Head: Normocephalic.  Eyes:     Pupils: Pupils are equal, round, and reactive to light.  Cardiovascular:     Rate and Rhythm: Normal rate and regular rhythm.     Pulses: Normal pulses.     Heart sounds: Murmur heard.  Pulmonary:     Effort: Pulmonary effort is normal.     Breath sounds: Normal breath sounds.  Musculoskeletal:        General: Normal range of motion.     Cervical back: Normal range of motion.  Skin:    General: Skin is warm.     Capillary Refill: Capillary refill takes less than 2 seconds.  Neurological:     General: No focal deficit present.     Mental Status: She is alert and oriented to person, place, and time.  Psychiatric:        Mood and Affect: Mood normal.        Behavior: Behavior normal.     PLAN Problem List Items Addressed This Visit     Type 2 diabetes mellitus with hyperglycemia, without long-term current use of insulin  (HCC) (Chronic)   Blood sugar levels are  well-controlled with current medication regimen. No significant side effects from Mounjaro , except occasional mild nausea. Weight loss noted, which is beneficial for diabetes management.      Relevant Orders   Hemoglobin A1c (Completed)   CBC with Differential/Platelet (Completed)   Comprehensive metabolic panel with GFR (Completed)   Iron, TIBC and Ferritin Panel (Completed)   TSH (Completed)   T4, free (Completed)   VITAMIN D  25 Hydroxy (Vit-D Deficiency, Fractures) (Completed)   Lipid panel (Completed)    Microalbumin / creatinine urine ratio (Completed)   Hypertension associated with diabetes (HCC)   Well controlled. No concerns at this time.       Relevant Orders   Hemoglobin A1c (Completed)   CBC with Differential/Platelet (Completed)   Comprehensive metabolic panel with GFR (Completed)   Iron, TIBC and Ferritin Panel (Completed)   TSH (Completed)   T4, free (Completed)   VITAMIN D  25 Hydroxy (Vit-D Deficiency, Fractures) (Completed)   Lipid panel (Completed)   Obesity (BMI 30-39.9)   Weight loss observed, contributing to improved blood pressure and diabetes control. Current medications, including Mounjaro , are aiding in weight management. Recommend diet and exercise management in addition to medication for optimal and sustained results.       Relevant Orders   Hemoglobin A1c (Completed)   CBC with Differential/Platelet (Completed)   Comprehensive metabolic panel with GFR (Completed)   Iron, TIBC and Ferritin Panel (Completed)   TSH (Completed)   T4, free (Completed)   VITAMIN D  25 Hydroxy (Vit-D Deficiency, Fractures) (Completed)   Lipid panel (Completed)   Estradiol (Completed)   FSH/LH (Completed)   Testosterone, Total, LC/MS/MS (Completed)   Vitamin D  deficiency   Repeat labs for evaluation      Relevant Orders   VITAMIN D  25 Hydroxy (Vit-D Deficiency, Fractures) (Completed)   Perimenopause   Symptoms consistent with perimenopause, including hot flashes, insomnia, mood changes, and brain fog, have been worsening over the past 6-8 months. Differential diagnosis includes thyroid  dysfunction. - Order hormone level tests and thyroid  function tests - Prescribe trazodone for sleep - Consider non-hormonal options like low-dose antidepressants or gabapentin if needed - Discussed hormonal replacement therapy, but she prefers non-hormonal options      Heat intolerance   Reports of near-blackout episodes and shortness of breath during hot and humid conditions suggest possible  mild intermittent asthma or heat/exertion intolerance. No current wheezing or respiratory distress. - Prescribe albuterol inhaler for use during episodes of shortness of breath or near-blackout - Monitor labs to ensure no concerning findings - ECHO ordered for murmur detected      Insomnia - Primary   Relevant Medications   traZODone (DESYREL) 50 MG tablet   Other Relevant Orders   Hemoglobin A1c (Completed)   CBC with Differential/Platelet (Completed)   Comprehensive metabolic panel with GFR (Completed)   Iron, TIBC and Ferritin Panel (Completed)   TSH (Completed)   T4, free (Completed)   VITAMIN D  25 Hydroxy (Vit-D Deficiency, Fractures) (Completed)   Lipid panel (Completed)   Estradiol (Completed)   FSH/LH (Completed)   Testosterone, Total, LC/MS/MS (Completed)   Hot flashes   Relevant Orders   Hemoglobin A1c (Completed)   CBC with Differential/Platelet (Completed)   Comprehensive metabolic panel with GFR (Completed)   Iron, TIBC and Ferritin Panel (Completed)   TSH (Completed)   T4, free (Completed)   VITAMIN D  25 Hydroxy (Vit-D Deficiency, Fractures) (Completed)   Lipid panel (Completed)   Estradiol (Completed)   FSH/LH (Completed)   Testosterone, Total, LC/MS/MS (  Completed)   Brain fog   Relevant Orders   Hemoglobin A1c (Completed)   CBC with Differential/Platelet (Completed)   Comprehensive metabolic panel with GFR (Completed)   Iron, TIBC and Ferritin Panel (Completed)   TSH (Completed)   T4, free (Completed)   VITAMIN D  25 Hydroxy (Vit-D Deficiency, Fractures) (Completed)   Lipid panel (Completed)   Estradiol (Completed)   FSH/LH (Completed)   Testosterone, Total, LC/MS/MS (Completed)   Dyslipidemia with low 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 (HCC)   Relevant Orders   Hemoglobin A1c (Completed)   CBC with Differential/Platelet (Completed)   Comprehensive metabolic panel with GFR (Completed)    Iron, TIBC and Ferritin Panel (Completed)   TSH (Completed)   T4, free (Completed)   VITAMIN D  25 Hydroxy (Vit-D Deficiency, Fractures) (Completed)   Lipid panel (Completed)   Chronic fatigue   Relevant Orders   Hemoglobin A1c (Completed)   CBC with Differential/Platelet (Completed)   Comprehensive metabolic panel with GFR (Completed)   Iron, TIBC and Ferritin Panel (Completed)   TSH (Completed)   T4, free (Completed)   VITAMIN D  25 Hydroxy (Vit-D Deficiency, Fractures) (Completed)   Lipid panel (Completed)   Estradiol (Completed)   FSH/LH (Completed)   Testosterone, Total, LC/MS/MS (Completed)   Other Visit Diagnoses       Murmur       Relevant Orders   ECHOCARDIOGRAM COMPLETE     Wheezing       Relevant Medications   albuterol (VENTOLIN HFA) 108 (90 Base) MCG/ACT inhaler           Return for CPE already scheduled in the spring.  SaraBeth Virgia Kelner, DNP, AGNP-c Time: 46 minutes, >50% spent counseling, care coordination, chart review, and documentation.  SABRAtime

## 2024-06-01 ENCOUNTER — Other Ambulatory Visit: Payer: Self-pay

## 2024-06-01 ENCOUNTER — Ambulatory Visit: Payer: Self-pay | Admitting: Nurse Practitioner

## 2024-06-01 DIAGNOSIS — D509 Iron deficiency anemia, unspecified: Secondary | ICD-10-CM

## 2024-06-01 DIAGNOSIS — D649 Anemia, unspecified: Secondary | ICD-10-CM

## 2024-06-01 DIAGNOSIS — R4189 Other symptoms and signs involving cognitive functions and awareness: Secondary | ICD-10-CM

## 2024-06-01 DIAGNOSIS — R011 Cardiac murmur, unspecified: Secondary | ICD-10-CM

## 2024-06-01 DIAGNOSIS — R5382 Chronic fatigue, unspecified: Secondary | ICD-10-CM

## 2024-06-01 NOTE — Telephone Encounter (Signed)
 Thank you so much! I am concerned with the ongoing bleeding. Once we get her levels improved we may need to further investigate this or reach out to general surgery for possible hemorrhoidectomy if this is the cause.

## 2024-06-04 ENCOUNTER — Observation Stay (HOSPITAL_COMMUNITY)
Admission: EM | Admit: 2024-06-04 | Discharge: 2024-06-06 | Disposition: A | Discharge status: Home / Self Care | Attending: Internal Medicine | Admitting: Internal Medicine

## 2024-06-04 ENCOUNTER — Other Ambulatory Visit: Payer: Self-pay

## 2024-06-04 ENCOUNTER — Emergency Department (HOSPITAL_COMMUNITY)

## 2024-06-04 ENCOUNTER — Telehealth: Payer: Self-pay

## 2024-06-04 ENCOUNTER — Encounter (HOSPITAL_COMMUNITY): Payer: Self-pay

## 2024-06-04 DIAGNOSIS — D62 Acute posthemorrhagic anemia: Secondary | ICD-10-CM | POA: Diagnosis not present

## 2024-06-04 DIAGNOSIS — Z794 Long term (current) use of insulin: Secondary | ICD-10-CM | POA: Diagnosis not present

## 2024-06-04 DIAGNOSIS — E876 Hypokalemia: Secondary | ICD-10-CM | POA: Diagnosis not present

## 2024-06-04 DIAGNOSIS — I10 Essential (primary) hypertension: Secondary | ICD-10-CM | POA: Diagnosis not present

## 2024-06-04 DIAGNOSIS — R197 Diarrhea, unspecified: Secondary | ICD-10-CM | POA: Diagnosis not present

## 2024-06-04 DIAGNOSIS — E1159 Type 2 diabetes mellitus with other circulatory complications: Secondary | ICD-10-CM | POA: Diagnosis not present

## 2024-06-04 DIAGNOSIS — E1165 Type 2 diabetes mellitus with hyperglycemia: Secondary | ICD-10-CM | POA: Diagnosis not present

## 2024-06-04 DIAGNOSIS — E663 Overweight: Secondary | ICD-10-CM | POA: Insufficient documentation

## 2024-06-04 DIAGNOSIS — K922 Gastrointestinal hemorrhage, unspecified: Secondary | ICD-10-CM | POA: Diagnosis not present

## 2024-06-04 DIAGNOSIS — K625 Hemorrhage of anus and rectum: Secondary | ICD-10-CM

## 2024-06-04 DIAGNOSIS — R6889 Other general symptoms and signs: Secondary | ICD-10-CM | POA: Insufficient documentation

## 2024-06-04 DIAGNOSIS — Z6829 Body mass index (BMI) 29.0-29.9, adult: Secondary | ICD-10-CM | POA: Diagnosis not present

## 2024-06-04 DIAGNOSIS — N951 Menopausal and female climacteric states: Secondary | ICD-10-CM | POA: Insufficient documentation

## 2024-06-04 DIAGNOSIS — Z79899 Other long term (current) drug therapy: Secondary | ICD-10-CM | POA: Insufficient documentation

## 2024-06-04 DIAGNOSIS — D649 Anemia, unspecified: Secondary | ICD-10-CM | POA: Diagnosis present

## 2024-06-04 DIAGNOSIS — I152 Hypertension secondary to endocrine disorders: Secondary | ICD-10-CM | POA: Diagnosis present

## 2024-06-04 HISTORY — DX: Iron deficiency anemia, unspecified: D50.9

## 2024-06-04 LAB — COMPREHENSIVE METABOLIC PANEL WITH GFR
ALT: 31 IU/L (ref 0–32)
ALT: 26 U/L (ref 0–44)
AST: 30 IU/L (ref 0–40)
AST: 22 U/L (ref 15–41)
Albumin: 4.6 g/dL (ref 3.8–4.9)
Albumin: 4.4 g/dL (ref 3.5–5.0)
Alkaline Phosphatase: 109 IU/L (ref 49–135)
Alkaline Phosphatase: 99 U/L (ref 38–126)
Anion gap: 13 (ref 5–15)
BUN/Creatinine Ratio: 19 (ref 9–23)
BUN: 12 mg/dL (ref 6–24)
BUN: 11 mg/dL (ref 6–20)
Bilirubin Total: 0.3 mg/dL (ref 0.0–1.2)
CO2: 18 mmol/L — ABNORMAL LOW (ref 20–29)
CO2: 23 mmol/L (ref 22–32)
Calcium: 9.4 mg/dL (ref 8.7–10.2)
Calcium: 9.5 mg/dL (ref 8.9–10.3)
Chloride: 104 mmol/L (ref 96–106)
Chloride: 104 mmol/L (ref 98–111)
Creatinine, Ser: 0.62 mg/dL (ref 0.57–1.00)
Creatinine, Ser: 0.65 mg/dL (ref 0.44–1.00)
GFR, Estimated: >60 mL/min (ref >60)
Globulin, Total: 2.3 g/dL (ref 1.5–4.5)
Glucose, Bld: 144 mg/dL — ABNORMAL HIGH (ref 70–99)
Glucose: 98 mg/dL (ref 70–99)
Potassium: 3.9 mmol/L (ref 3.5–5.2)
Potassium: 3.2 mmol/L — ABNORMAL LOW (ref 3.5–5.1)
Sodium: 139 mmol/L (ref 134–144)
Sodium: 140 mmol/L (ref 135–145)
Total Bilirubin: 0.2 mg/dL (ref 0.0–1.2)
Total Protein: 6.9 g/dL (ref 6.0–8.5)
Total Protein: 6.9 g/dL (ref 6.5–8.1)
eGFR: 107 mL/min/1.73 (ref >59)

## 2024-06-04 LAB — CBC WITH DIFFERENTIAL/PLATELET
Abs Immature Granulocytes: 0.06 K/uL (ref 0.00–0.07)
Basophils Absolute: 0.1 x10E3/uL (ref 0.0–0.2)
Basophils Absolute: 0 K/uL (ref 0.0–0.1)
Basophils Relative: 1 %
Basos: 1 %
EOS (ABSOLUTE): 0.2 x10E3/uL (ref 0.0–0.4)
Eos: 3 %
Eosinophils Absolute: 0.2 K/uL (ref 0.0–0.5)
Eosinophils Relative: 3 %
HCT: 22.4 % — ABNORMAL LOW (ref 36.0–46.0)
Hematocrit: 24.9 % — ABNORMAL LOW (ref 34.0–46.6)
Hemoglobin: 6.6 g/dL — CL (ref 11.1–15.9)
Hemoglobin: 6.2 g/dL — CL (ref 12.0–15.0)
Immature Grans (Abs): 0.1 x10E3/uL (ref 0.0–0.1)
Immature Granulocytes: 1 %
Immature Granulocytes: 1 %
Lymphocytes Absolute: 1.6 x10E3/uL (ref 0.7–3.1)
Lymphocytes Relative: 27 %
Lymphs Abs: 1.6 K/uL (ref 0.7–4.0)
Lymphs: 28 %
MCH: 17.1 pg — ABNORMAL LOW (ref 26.6–33.0)
MCH: 17.3 pg — ABNORMAL LOW (ref 26.0–34.0)
MCHC: 26.5 g/dL — ABNORMAL LOW (ref 31.5–35.7)
MCHC: 27.7 g/dL — ABNORMAL LOW (ref 30.0–36.0)
MCV: 65 fL — ABNORMAL LOW (ref 79–97)
MCV: 62.6 fL — ABNORMAL LOW (ref 80.0–100.0)
Monocytes Absolute: 0.4 x10E3/uL (ref 0.1–0.9)
Monocytes Absolute: 0.3 K/uL (ref 0.1–1.0)
Monocytes Relative: 5 %
Monocytes: 7 %
Neutro Abs: 3.8 K/uL (ref 1.7–7.7)
Neutrophils Absolute: 3.5 x10E3/uL (ref 1.4–7.0)
Neutrophils Relative %: 63 %
Neutrophils: 60 %
Platelets: 386 x10E3/uL (ref 150–450)
Platelets: 412 K/uL — ABNORMAL HIGH (ref 150–400)
RBC: 3.85 x10E6/uL (ref 3.77–5.28)
RBC: 3.58 MIL/uL — ABNORMAL LOW (ref 3.87–5.11)
RDW: 17.3 % — ABNORMAL HIGH (ref 11.7–15.4)
RDW: 18.1 % — ABNORMAL HIGH (ref 11.5–15.5)
Smear Review: NORMAL
WBC: 5.8 x10E3/uL (ref 3.4–10.8)
WBC: 6 K/uL (ref 4.0–10.5)
nRBC: 0 % (ref 0.0–0.2)

## 2024-06-04 LAB — IRON,TIBC AND FERRITIN PANEL
Ferritin: 4 ng/mL — ABNORMAL LOW (ref 15–150)
Iron Saturation: 2 % — CL (ref 15–55)
Iron: 9 ug/dL — CL (ref 27–159)
Total Iron Binding Capacity: 499 ug/dL — ABNORMAL HIGH (ref 250–450)
UIBC: 490 ug/dL — ABNORMAL HIGH (ref 131–425)

## 2024-06-04 LAB — PROTIME-INR
INR: 0.9 (ref 0.8–1.2)
Prothrombin Time: 12.3 s (ref 11.4–15.2)

## 2024-06-04 LAB — TSH: TSH: 0.772 u[IU]/mL (ref 0.450–4.500)

## 2024-06-04 LAB — HEMOGLOBIN A1C
Est. average glucose Bld gHb Est-mCnc: 128 mg/dL
Hgb A1c MFr Bld: 6.1 % — ABNORMAL HIGH (ref 4.8–5.6)

## 2024-06-04 LAB — LIPID PANEL
Chol/HDL Ratio: 2.9 ratio (ref 0.0–4.4)
Cholesterol, Total: 167 mg/dL (ref 100–199)
HDL: 58 mg/dL (ref >39)
LDL Chol Calc (NIH): 80 mg/dL (ref 0–99)
Triglycerides: 173 mg/dL — ABNORMAL HIGH (ref 0–149)
VLDL Cholesterol Cal: 29 mg/dL (ref 5–40)

## 2024-06-04 LAB — MICROALBUMIN / CREATININE URINE RATIO
Creatinine, Urine: 183.3 mg/dL
Microalb/Creat Ratio: 5 mg/g{creat} (ref 0–29)
Microalbumin, Urine: 9.2 ug/mL

## 2024-06-04 LAB — POC OCCULT BLOOD, ED: Fecal Occult Bld: NEGATIVE

## 2024-06-04 LAB — VITAMIN D 25 HYDROXY (VIT D DEFICIENCY, FRACTURES): Vit D, 25-Hydroxy: 62.1 ng/mL (ref 30.0–100.0)

## 2024-06-04 LAB — FSH/LH
FSH: 54.3 m[IU]/mL
LH: 39.5 m[IU]/mL

## 2024-06-04 LAB — MAGNESIUM: Magnesium: 2 mg/dL (ref 1.7–2.4)

## 2024-06-04 LAB — PREPARE RBC (CROSSMATCH)

## 2024-06-04 LAB — T4, FREE: Free T4: 1.12 ng/dL (ref 0.82–1.77)

## 2024-06-04 LAB — TESTOSTERONE, TOTAL, LC/MS/MS: Testosterone, total: 5.9 ng/dL

## 2024-06-04 LAB — ABO/RH: ABO/RH(D): O POS

## 2024-06-04 LAB — ESTRADIOL: Estradiol: 9.1 pg/mL

## 2024-06-04 MED ORDER — PANTOPRAZOLE SODIUM 40 MG PO TBEC: 40.0000 mg | DELAYED_RELEASE_TABLET | Freq: Every evening | ORAL | Status: DC

## 2024-06-04 MED ORDER — TRAZODONE HCL 50 MG PO TABS: 50.0000 mg | ORAL_TABLET | Freq: Every evening | ORAL | Status: DC | PRN

## 2024-06-04 MED ORDER — POTASSIUM CHLORIDE 20 MEQ PO PACK
40.0000 meq | PACK | Freq: Once | ORAL | Status: AC
  Administered 2024-06-04: 40 meq via ORAL
  Filled 2024-06-04: qty 2

## 2024-06-04 MED ORDER — AMLODIPINE BESYLATE 5 MG PO TABS
10.0000 mg | ORAL_TABLET | Freq: Every day | ORAL | Status: DC
  Administered 2024-06-04 – 2024-06-06 (×3): 10 mg via ORAL
  Filled 2024-06-04 (×3): qty 2

## 2024-06-04 MED ORDER — ACETAMINOPHEN 650 MG RE SUPP: 650.0000 mg | Freq: Four times a day (QID) | RECTAL | Status: DC | PRN

## 2024-06-04 MED ORDER — METHOCARBAMOL 500 MG PO TABS
1000.0000 mg | ORAL_TABLET | Freq: Once | ORAL | Status: AC
  Administered 2024-06-04: 1000 mg via ORAL
  Filled 2024-06-04: qty 2

## 2024-06-04 MED ORDER — ACETAMINOPHEN 325 MG PO TABS
650.0000 mg | ORAL_TABLET | Freq: Four times a day (QID) | ORAL | Status: DC | PRN
  Administered 2024-06-05 (×2): 650 mg via ORAL
  Filled 2024-06-04 (×2): qty 2

## 2024-06-04 MED ORDER — SODIUM CHLORIDE 0.9% IV SOLUTION
Freq: Once | INTRAVENOUS | Status: AC
  Administered 2024-06-04: 10 mL via INTRAVENOUS

## 2024-06-04 MED ORDER — ONDANSETRON HCL 4 MG/2ML IJ SOLN: 4.0000 mg | Freq: Four times a day (QID) | INTRAMUSCULAR | Status: DC | PRN

## 2024-06-04 MED ORDER — DIPHENHYDRAMINE HCL 50 MG/ML IJ SOLN
12.5000 mg | Freq: Once | INTRAMUSCULAR | Status: AC
  Administered 2024-06-04: 12.5 mg via INTRAVENOUS
  Filled 2024-06-04: qty 1

## 2024-06-04 MED ORDER — BISACODYL 5 MG PO TBEC: 5.0000 mg | DELAYED_RELEASE_TABLET | Freq: Every day | ORAL | Status: DC | PRN

## 2024-06-04 MED ORDER — INSULIN ASPART 100 UNIT/ML IJ SOLN: 0.0000 [IU] | Freq: Three times a day (TID) | INTRAMUSCULAR | Status: DC

## 2024-06-04 MED ORDER — ONDANSETRON HCL 4 MG PO TABS: 4.0000 mg | ORAL_TABLET | Freq: Four times a day (QID) | ORAL | Status: DC | PRN

## 2024-06-04 MED ORDER — METOCLOPRAMIDE HCL 5 MG/ML IJ SOLN
10.0000 mg | Freq: Once | INTRAMUSCULAR | Status: AC
  Administered 2024-06-04: 10 mg via INTRAVENOUS
  Filled 2024-06-04: qty 2

## 2024-06-04 MED ORDER — LACTATED RINGERS IV BOLUS
1000.0000 mL | Freq: Once | INTRAVENOUS | Status: AC
  Administered 2024-06-04: 1000 mL via INTRAVENOUS

## 2024-06-04 MED ORDER — PANTOPRAZOLE SODIUM 40 MG IV SOLR
40.0000 mg | Freq: Two times a day (BID) | INTRAVENOUS | Status: DC
Start: 2024-06-04 — End: 2024-06-06
  Administered 2024-06-04 – 2024-06-06 (×4): 40 mg via INTRAVENOUS
  Filled 2024-06-04 (×4): qty 10

## 2024-06-04 NOTE — ED Notes (Signed)
 Date and time results received: 06/04/24 1439 (use smartphrase .now to insert current time)  Test: H/H Critical Value: 6.2  Name of Provider Notified: Melvenia

## 2024-06-04 NOTE — ED Provider Notes (Addendum)
 [place] EM[facility] DEPARTMENT AT [facility] Provider Note   CSN: [id number] Arrival date & time: 06/04/24  1246     Patient presents with: Anemia   Karen Hebert is a 53 y.o. female.    Anemia Associated symptoms include headaches.  Patient presents for recent outpatient lab work results.  Medical history includes DM, HTN, HLD, Crohn's disease, prior GI bleed.  She has a history of questionable Crohn's disease.  She has never been on any treatment for Crohn's.  She has undergone colonoscopies and been told that she does not have Crohn's.  Despite this, she will have intermittent abdominal discomfort, indigestion.  She states that she has had intermittent episodes of red blood in stool over the past several months.  For the past 2 months, stool has been watery in nature.  Last episode of red stool was 5 days ago.  She was seen by PCP 4 days ago for perimenopausal symptoms.  Lab work panel was ordered at that time.  CBC showed a hemoglobin of 6.6, down from baseline of 11 6 months ago.  She was also found to have iron deficiency.  Patient denies any current abdominal discomfort.  She has had recent back pain which has resolved.  She currently endorses neck pain and headache.  She has had ongoing fatigue.       Prior to Admission medications   Medication Sig Start Date End Date Taking? Authorizing Provider  albuterol (VENTOLIN HFA) 108 (90 Base) MCG/ACT inhaler Inhale 2 puffs into the lungs every 6 (six) hours as needed for wheezing or shortness of breath. 05/31/24   Early, Sara E, NP  amLODipine  (NORVASC ) 5 MG tablet Take 2 tablets (10 mg total) by mouth daily. 01/04/24   Early, Sara E, NP  cholecalciferol (VITAMIN D3) 25 MCG (1000 UNIT) tablet Take 1,000 Units by mouth daily.    [provider]  metFORMIN  (GLUCOPHAGE -XR) 500 MG 24 hr tablet Take 3 tablets (1,500 mg total) by mouth daily. Take one at dinner may increase to 2 at dinner after 1 week then may increase to  3 at dinner after another week and no issues with the medicine. 02/16/24   Early, Sara E, NP  Multiple Vitamin (MULTIVITAMIN WITH MINERALS) TABS tablet Take 1 tablet by mouth daily.    [provider]  ondansetron  (ZOFRAN -ODT) 4 MG disintegrating tablet Take 1 tablet (4 mg total) by mouth every 8 (eight) hours as needed for nausea or vomiting. 01/04/24   Early, Sara E, NP  pantoprazole  (PROTONIX ) 40 MG tablet Take 1 tablet (40 mg total) by mouth 2 (two) times daily. 12/20/23 12/19/24  Cindie Carlin POUR, DO  tirzepatide  (MOUNJARO ) 10 MG/0.5ML Pen Inject 10 mg into the skin once a week. 04/16/24   Early, Sara E, NP  traZODone (DESYREL) 50 MG tablet Take 0.5-2 tablets (25-100 mg total) by mouth at bedtime as needed for sleep. 05/31/24   Early, Sara E, NP    Allergies: Penicillins    Review of Systems  Constitutional:  Positive for fatigue.  Gastrointestinal:  Positive for blood in stool and diarrhea.  Musculoskeletal:  Positive for neck pain.  Neurological:  Positive for headaches.  All other systems reviewed and are negative.   Updated Vital Signs BP (!) 178/95   Pulse 76   Temp 98 F (36.7 C) (Oral)   Resp 15   Ht 5' 9 (1.753 m)   Wt 91.7 kg   SpO2 100%   BMI 29.86 kg/m  Physical Exam Vitals and nursing note reviewed. Exam conducted with a chaperone present.  Constitutional:      General: She is not in acute distress.    Appearance: Normal appearance. She is well-developed. She is not ill-appearing, toxic-appearing or diaphoretic.  HENT:     Head: Normocephalic and atraumatic.     Right Ear: External ear normal.     Left Ear: External ear normal.     Nose: Nose normal.     Mouth/Throat:     Mouth: Mucous membranes are moist.  Eyes:     Extraocular Movements: Extraocular movements intact.     Conjunctiva/sclera: Conjunctivae normal.  Cardiovascular:     Rate and Rhythm: Normal rate and regular rhythm.  Pulmonary:     Effort: Pulmonary effort is normal. No respiratory  distress.  Abdominal:     General: There is no distension.     Palpations: Abdomen is soft.     Tenderness: There is no abdominal tenderness.  Genitourinary:    Rectum: Guaiac result negative. No internal hemorrhoid.     Comments: No stool present on glove with DRE. Musculoskeletal:        General: No swelling.     Cervical back: Normal range of motion and neck supple.  Skin:    General: Skin is warm and dry.     Coloration: Skin is not jaundiced or pale.  Neurological:     General: No focal deficit present.     Mental Status: She is alert and oriented to person, place, and time.     Cranial Nerves: No cranial nerve deficit.     Sensory: No sensory deficit.     Motor: No weakness.     Coordination: Coordination normal.  Psychiatric:        Mood and Affect: Mood normal.        Behavior: Behavior normal.     (all labs ordered are listed, but only abnormal results are displayed) Labs Reviewed  CBC WITH DIFFERENTIAL/PLATELET - Abnormal; Notable for the following components:      Result Value   RBC 3.58 (*)    Hemoglobin 6.2 (*)    HCT 22.4 (*)    MCV 62.6 (*)    MCH 17.3 (*)    MCHC 27.7 (*)    RDW 18.1 (*)    Platelets 412 (*)    All other components within normal limits  COMPREHENSIVE METABOLIC PANEL WITH GFR - Abnormal; Notable for the following components:   Potassium 3.2 (*)    Glucose, Bld 144 (*)    All other components within normal limits  PROTIME-INR  MAGNESIUM  POC OCCULT BLOOD, ED  TYPE AND SCREEN  PREPARE RBC (CROSSMATCH)  ABO/RH    EKG: None  Radiology: No results found.   Procedures   Medications Ordered in the ED  0.9 %  sodium chloride  infusion (Manually program via Guardrails IV Fluids) (has no administration in time range)  metoCLOPramide (REGLAN) injection 10 mg (10 mg Intravenous Given 06/04/24 1534)  diphenhydrAMINE (BENADRYL) injection 12.5 mg (12.5 mg Intravenous Given 06/04/24 1532)  lactated ringers  bolus 1,000 mL (1,000 mLs  Intravenous New Bag/Given 06/04/24 1538)  methocarbamol (ROBAXIN) tablet 1,000 mg (1,000 mg Oral Given 06/04/24 1531)  potassium chloride  (KLOR-CON ) packet 40 mEq (40 mEq Oral Given 06/04/24 1530)  Medical Decision Making Amount and/or Complexity of Data Reviewed Labs: ordered. Radiology: ordered.  Risk Prescription drug management. Decision regarding hospitalization.   This patient presents to the ED for concern of low hemoglobin, this involves an extensive number of treatment options, and is a complaint that carries with it a high risk of complications and morbidity.  The differential diagnosis includes blood loss anemia, iron deficiency, decreased hematopoiesis   Co morbidities / Chronic conditions that complicate the patient evaluation  DM, HTN, HLD, Crohn's disease, prior GI bleed   Additional history obtained:  Additional history obtained from EMR External records from outside source obtained and reviewed including N/A   Lab Tests:  I Ordered, and personally interpreted labs.  The pertinent results include: Hemoglobin of 6.2, down from baseline of 11 six months ago; microcytosis suggestive of iron deficiency; hypokalemia with otherwise normal electrolytes   Imaging Studies ordered:  I ordered imaging studies including CT of head and cervical spine I independently visualized and interpreted imaging which showed no acute findings I agree with the radiologist interpretation   Cardiac Monitoring: / EKG:  The patient was maintained on a cardiac monitor.  I personally viewed and interpreted the cardiac monitored which showed an underlying rhythm of: Sinus rhythm   Problem List / ED Course / Critical interventions / Medication management  Patient presenting for low hemoglobin, identified on routine outpatient lab work 4 days ago.  From baseline of 11, hemoglobin is down to 6.6.   On exam, patient is overall well-appearing.  She reports  intermittent red blood in stool over the past several months.  On DRE today, no stool was present on glove.  Hemoccult testing was negative.  Per chart review, patient underwent upper and lower endoscopy with Dr. Cindie in April.  No areas of bleeding were identified at the time.  She did have evidence of gastritis as well as nonbleeding hemorrhoids.  Lab work today confirms low hemoglobin, now 6.2.  2 units PRBCs was ordered.  Headache cocktail was ordered for her headache.  Given her headache and neck pain, will obtain imaging of CT and cervical spine.  I spoke with gastroenterologist on-call, Dr. Cindie, who would like patient admitted for evaluation in the morning.  CT of head and cervical spine showed no acute findings.  On reassessment, headache is improved.  Patient was admitted for further management. I ordered medication including IV fluids and headache cocktail for headache and neck pain; potassium chloride  for hypokalemia; PRBCs for symptomatic anemia Reevaluation of the patient after these medicines showed that the patient improved I have reviewed the patients home medicines and have made adjustments as needed   Consultations Obtained:  I requested consultation with the neurologist, Dr. Cindie,  and discussed lab and imaging findings as well as pertinent plan - they recommend: Will see in consult   Social Determinants of Health:  Lives independently  CRITICAL CARE Performed by: Bernardino Fireman   Total critical care time: 31 minutes  Critical care time was exclusive of separately billable procedures and treating other patients.  Critical care was necessary to treat or prevent imminent or life-threatening deterioration.  Critical care was time spent personally by me on the following activities: development of treatment plan with patient and/or surrogate as well as nursing, discussions with consultants, evaluation of patient's response to treatment, examination of patient, obtaining  history from patient or surrogate, ordering and performing treatments and interventions, ordering and review of laboratory studies, ordering and review of radiographic studies, pulse oximetry  and re-evaluation of patient's condition.   Final diagnoses:  Lower GI bleed  Symptomatic anemia    ED Discharge Orders     None          Melvenia Motto, MD 06/04/24 1621    Melvenia Motto, MD 06/04/24 1626

## 2024-06-04 NOTE — ED Triage Notes (Signed)
 Pt arrived via POV c/o headache, fatigue and reports pain radiates down her back from her neck. Pt reports recently having blood work drawn by her PCP, and reports receiving a phone call today advising her to go to the ER for an iron infusion.

## 2024-06-04 NOTE — Telephone Encounter (Signed)
 Copied from CRM #[id number]. Topic: Appointments - Scheduling Inquiry for Clinic >> Jun 04, 2024 10:09 AM Charlet HERO wrote: Reason for CRM: Patient is calling to get scheduled for blood transfusion to patient care, reached out to office for further asst. She is going to call the patient back to schedule the appt .

## 2024-06-04 NOTE — ED Notes (Signed)
 Pt in bed, no rash noted, pt denies itchiness, denies any feelings like her throat is closing, resps even and unlabored,

## 2024-06-04 NOTE — Assessment & Plan Note (Signed)
 Blood sugar levels are well-controlled with current medication regimen. No significant side effects from Mounjaro , except occasional mild nausea. Weight loss noted, which is beneficial for diabetes management.

## 2024-06-04 NOTE — Assessment & Plan Note (Signed)
 Symptoms consistent with perimenopause, including hot flashes, insomnia, mood changes, and brain fog, have been worsening over the past 6-8 months. Differential diagnosis includes thyroid  dysfunction. - Order hormone level tests and thyroid  function tests - Prescribe trazodone for sleep - Consider non-hormonal options like low-dose antidepressants or gabapentin if needed - Discussed hormonal replacement therapy, but she prefers non-hormonal options

## 2024-06-04 NOTE — Assessment & Plan Note (Signed)
 Weight loss observed, contributing to improved blood pressure and diabetes control. Current medications, including Mounjaro , are aiding in weight management. Recommend diet and exercise management in addition to medication for optimal and sustained results.

## 2024-06-04 NOTE — Assessment & Plan Note (Signed)
 Repeat labs for evaluation.

## 2024-06-04 NOTE — Assessment & Plan Note (Signed)
 Well controlled. No concerns at this time.

## 2024-06-04 NOTE — ED Notes (Signed)
 Pt has no complaints at this time

## 2024-06-04 NOTE — H&P (Signed)
 History and Physical    Patient: Karen Hebert FMW:[id number] DOB: Jan 25, 1971 DOA: 06/04/2024 DOS: the patient was seen and examined on 06/04/2024 PCP: Oris Camie BRAVO, NP  Patient coming from: Home  Chief Complaint:  Chief Complaint  Patient presents with   Anemia   HPI: Karen Hebert is a 53 y.o. female with medical history significant for intermittent rectal bleeding, constipation, mild OSA currently being evaluated for treatment and type 2 diabetes mellitus who presented to her primary care physician 4 days ago because of several weeks of dizziness and fatigue and hot flashes.  She also has had trouble with shortness of breath with exertion and some chest pain with exertion.  Her primary provider did blood work and and she was called 3 days ago and told that she needed a transfusion.  The plan was to try to arrange that as an outpatient but was not able to be done so the patient came into the ED.  She reports that she last had rectal bleeding about a month ago.   She has had watery diarrhea for the last 4 days which is unusual for her.  She also reports that she has lost about 15 pounds in the last month which is also unusual for her.  She has been on Mounjaro  or some GLP-1 medication for over a year and she has maybe lost 15 pounds in a year. Its unclear why she is losing so much more this month.  Usually her rectal bleeding starts with a bowel movement.  She will see bright red blood then after the bowel movement is over she will continue to bleed and even need pads for underwear protection.  She will sometimes bleed up to 3 or 4 days after the bowel movement.  Then it will stop on its own and she may go many weeks without this happening again.  No abdominal pain. She did have a colonoscopy 6 months ago and she tells me it was suggested that this may be hemorrhoidal bleeding. She was referred to a surgeon but declined hemorrhoidal surgery because of the possible complications. Emergency  department patient was evaluated and found to have a hemoglobin of 6.2.  Transfusion of 2 units of packed red blood cells have already been ordered by the ED provider.  She will be admitted to the medical service with a GI consultation.  Review of Systems: As mentioned in the history of present illness. All other systems reviewed and are negative. 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 She has never been exposed to tobacco smoke. She has never used smokeless tobacco. She reports that she does not currently use alcohol. She reports that she does not currently use drugs.  Allergies  Allergen Reactions   Penicillins Anaphylaxis    Family History  Adopted: Yes  Family history unknown: Yes    Prior to Admission medications   Medication Sig Start Date End Date Taking? Authorizing Provider  albuterol (VENTOLIN HFA) 108 (90 Base) MCG/ACT inhaler Inhale 2 puffs into the lungs every 6 (six) hours as needed for wheezing or shortness of  breath. 05/31/24  Yes Early, Sara E, NP  amLODipine  (NORVASC ) 5 MG tablet Take 2 tablets (10 mg total) by mouth daily. 01/04/24  Yes Early, Sara E, NP  cholecalciferol (VITAMIN D3) 25 MCG (1000 UNIT) tablet Take 1,000 Units by mouth daily.   Yes [provider]  metFORMIN  (GLUCOPHAGE -XR) 500 MG 24 hr tablet Take 3 tablets (1,500 mg total) by mouth daily. Take one at dinner may increase to 2 at dinner after 1 week then may increase to 3 at dinner after another week and no issues with the medicine. Patient taking differently: Take 1,500 mg by mouth daily. 02/16/24  Yes Early, Sara E, NP  Multiple Vitamin (MULTIVITAMIN WITH MINERALS) TABS tablet Take 1 tablet by mouth daily.   Yes [provider]  ondansetron  (ZOFRAN -ODT) 4 MG disintegrating tablet Take 1 tablet (4 mg total) by mouth every 8 (eight) hours as needed for nausea or vomiting. 01/04/24  Yes Early, Sara E, NP  pantoprazole  (PROTONIX ) 40 MG tablet Take 1 tablet (40 mg total) by mouth 2 (two) times daily. Patient taking differently: Take 40 mg by mouth every evening. 12/20/23 12/19/24 Yes Carver, Charles K, DO  tirzepatide  (MOUNJARO ) 10 MG/0.5ML Pen Inject 10 mg into the skin once a week. Patient taking differently: Inject 10 mg into the skin every Saturday. 04/16/24  Yes Early, Sara E, NP  traZODone (DESYREL) 50 MG tablet Take 0.5-2 tablets (25-100 mg total) by mouth at bedtime as needed for sleep. 05/31/24  Yes EarlyCamie BRAVO, NP    Physical Exam: Vitals:   06/04/24 1930 06/04/24 1945 06/04/24 2000 06/04/24 2026  BP: (!) 155/90 (!) 158/84 (!) 152/94 (!) 153/82  Pulse: 76 69 78 72  Resp: 13 16 20 18   Temp:   98.3 F (36.8 C) 98 F (36.7 C)  TempSrc:   Oral Oral  SpO2: 100% 100% 100%   Weight:      Height:       Physical Exam:  General: No acute distress, well developed, well nourished HEENT: Normocephalic, atraumatic, PERRL Cardiovascular: Normal rate and rhythm. Distal pulses intact. Pulmonary: Normal pulmonary effort,  normal breath sounds Gastrointestinal: Nondistended abdomen, soft, non-tender, normoactive bowel sounds Musculoskeletal:Normal ROM, no lower ext edema Lymphadenopathy: No cervical LAD. Skin: Skin is warm and dry. Neuro: No focal deficits noted, AAOx3. PSYCH: Attentive and cooperative  Data Reviewed:  Results for orders placed or performed during the hospital encounter of 06/04/24 (from the past 24 hours)  CBC with Differential     Status: Abnormal   Collection Time: 06/04/24  1:50 PM  Result Value Ref Range   WBC 6.0 4.0 - 10.5 K/uL   RBC 3.58 (L) 3.87 - 5.11 MIL/uL   Hemoglobin 6.2 (LL) 12.0 - 15.0 g/dL   HCT 77.5 (L) 63.9 - 53.9 %   MCV 62.6 (L) 80.0 - 100.0 fL   MCH 17.3 (L) 26.0 - 34.0  pg   MCHC 27.7 (L) 30.0 - 36.0 g/dL   RDW 81.8 (H) 88.4 - 84.4 %   Platelets 412 (H) 150 - 400 K/uL   nRBC 0.0 0.0 - 0.2 %   Neutrophils Relative % 63 %   Neutro Abs 3.8 1.7 - 7.7 K/uL   Lymphocytes Relative 27 %   Lymphs Abs 1.6 0.7 - 4.0 K/uL   Monocytes Relative 5 %   Monocytes Absolute 0.3 0.1 - 1.0 K/uL   Eosinophils Relative 3 %   Eosinophils Absolute 0.2 0.0 - 0.5 K/uL   Basophils Relative 1 %   Basophils Absolute 0.0 0.0 - 0.1 K/uL   WBC Morphology MORPHOLOGY UNREMARKABLE    Smear Review Normal platelet morphology    Immature Granulocytes 1 %   Abs Immature Granulocytes 0.06 0.00 - 0.07 K/uL   Polychromasia PRESENT    Ovalocytes PRESENT   Comprehensive metabolic panel     Status: Abnormal   Collection Time: 06/04/24  1:50 PM  Result Value Ref Range   Sodium 140 135 - 145 mmol/L   Potassium 3.2 (L) 3.5 - 5.1 mmol/L   Chloride 104 98 - 111 mmol/L   CO2 23 22 - 32 mmol/L   Glucose, Bld 144 (H) 70 - 99 mg/dL   BUN 11 6 - 20 mg/dL   Creatinine, Ser 9.34 0.44 - 1.00 mg/dL   Calcium 9.5 8.9 - 89.6 mg/dL   Total Protein 6.9 6.5 - 8.1 g/dL   Albumin 4.4 3.5 - 5.0 g/dL   AST 22 15 - 41 U/L   ALT 26 0 - 44 U/L   Alkaline Phosphatase 99 38 - 126 U/L   Total Bilirubin 0.2 0.0 -  1.2 mg/dL   GFR, Estimated >39 >39 mL/min   Anion gap 13 5 - 15  Type and screen [facility]     Status: None (Preliminary result)   Collection Time: 06/04/24  1:50 PM  Result Value Ref Range   ABO/RH(D) O POS    Antibody Screen NEG    Sample Expiration 06/07/2024,2359    Unit Number [id number]    Blood Component Type RED CELLS,LR    Unit division 00    Status of Unit ISSUED    Transfusion Status OK TO TRANSFUSE    Crossmatch Result Compatible    Unit Number [id number]    Blood Component Type RED CELLS,LR    Unit division 00    Status of Unit ISSUED    Transfusion Status OK TO TRANSFUSE    Crossmatch Result      Compatible Performed at [facility], [address] [place], [place] [address]   Protime-INR     Status: None   Collection Time: 06/04/24  1:50 PM  Result Value Ref Range   Prothrombin Time 12.3 11.4 - 15.2 seconds   INR 0.9 0.8 - 1.2  Magnesium     Status: None   Collection Time: 06/04/24  1:50 PM  Result Value Ref Range   Magnesium 2.0 1.7 - 2.4 mg/dL  POC occult blood, ED     Status: None   Collection Time: 06/04/24  2:31 PM  Result Value Ref Range   Fecal Occult Bld NEGATIVE NEGATIVE  Prepare RBC (crossmatch)     Status: None   Collection Time: 06/04/24  3:10 PM  Result Value Ref Range   Order Confirmation      ORDER PROCESSED BY BLOOD BANK Performed at [facility], [address] [place], [place] [address]  ABO/Rh     Status: None   Collection Time: 06/04/24  3:28 PM  Result Value Ref Range   ABO/RH(D)      O POS Performed at [facility], [address] [place], [place] [address]      Assessment and Plan: Acute blood loss anemia/bright red blood per rectum -Transfuse 2 units packed red blood cells to start - N.p.o. after midnight - GI was consulted by the ED physician - Hold nonessential medications  2. DMT2 -  - Sliding scale only while the patient is NPO.   She did take her once weekly Mounjaro  just 2 days ago.  3.  Diarrhea x 4 days - Monitor  4. Hypokalemia - replete  5. Htn - Continue Norvasc      Advance Care Planning:   Code Status: Full Code the patient her daughter is her surrogate decision maker and she wants to be full code.  Consults: GI  Family Communication: none  Severity of Illness: The appropriate patient status for this patient is INPATIENT. Inpatient status is judged to be reasonable and necessary in order to provide the required intensity of service to ensure the patient's safety. The patient's presenting symptoms, physical exam findings, and initial radiographic and laboratory data in the context of their chronic comorbidities is felt to place them at high risk for further clinical deterioration. Furthermore, it is not anticipated that the patient will be medically stable for discharge from the hospital within 2 midnights of admission.   * I certify that at the point of admission it is my clinical judgment that the patient will require inpatient hospital care spanning beyond 2 midnights from the point of admission due to high intensity of service, high risk for further deterioration and high frequency of surveillance required.*  Author: ARTHEA CHILD, MD 06/04/2024 8:46 PM  For on call review [email or web address].

## 2024-06-04 NOTE — Assessment & Plan Note (Signed)
 Reports of near-blackout episodes and shortness of breath during hot and humid conditions suggest possible mild intermittent asthma or heat/exertion intolerance. No current wheezing or respiratory distress. - Prescribe albuterol inhaler for use during episodes of shortness of breath or near-blackout - Monitor labs to ensure no concerning findings - ECHO ordered for murmur detected

## 2024-06-05 ENCOUNTER — Encounter (HOSPITAL_COMMUNITY)
Admission: EM | Disposition: A | Discharge status: Home / Self Care | Payer: Self-pay | Source: Home / Self Care | Attending: Emergency Medicine

## 2024-06-05 ENCOUNTER — Other Ambulatory Visit: Payer: Self-pay | Admitting: Gastroenterology

## 2024-06-05 ENCOUNTER — Encounter (HOSPITAL_COMMUNITY): Payer: Self-pay | Admitting: Internal Medicine

## 2024-06-05 ENCOUNTER — Telehealth: Payer: Self-pay | Admitting: Nurse Practitioner

## 2024-06-05 DIAGNOSIS — I152 Hypertension secondary to endocrine disorders: Secondary | ICD-10-CM | POA: Diagnosis not present

## 2024-06-05 DIAGNOSIS — K625 Hemorrhage of anus and rectum: Secondary | ICD-10-CM | POA: Diagnosis not present

## 2024-06-05 DIAGNOSIS — R194 Change in bowel habit: Secondary | ICD-10-CM | POA: Diagnosis not present

## 2024-06-05 DIAGNOSIS — D509 Iron deficiency anemia, unspecified: Secondary | ICD-10-CM | POA: Diagnosis not present

## 2024-06-05 DIAGNOSIS — E1159 Type 2 diabetes mellitus with other circulatory complications: Secondary | ICD-10-CM | POA: Diagnosis not present

## 2024-06-05 DIAGNOSIS — D62 Acute posthemorrhagic anemia: Secondary | ICD-10-CM | POA: Diagnosis not present

## 2024-06-05 DIAGNOSIS — E1165 Type 2 diabetes mellitus with hyperglycemia: Secondary | ICD-10-CM | POA: Diagnosis not present

## 2024-06-05 HISTORY — PX: GIVENS CAPSULE STUDY: SHX5432

## 2024-06-05 LAB — CBC
HCT: 28.2 % — ABNORMAL LOW (ref 36.0–46.0)
Hemoglobin: 8.1 g/dL — ABNORMAL LOW (ref 12.0–15.0)
MCH: 19.1 pg — ABNORMAL LOW (ref 26.0–34.0)
MCHC: 28.7 g/dL — ABNORMAL LOW (ref 30.0–36.0)
MCV: 66.4 fL — ABNORMAL LOW (ref 80.0–100.0)
Platelets: 364 K/uL (ref 150–400)
RBC: 4.25 MIL/uL (ref 3.87–5.11)
RDW: 22.2 % — ABNORMAL HIGH (ref 11.5–15.5)
WBC: 5.9 K/uL (ref 4.0–10.5)
nRBC: 0.3 % — ABNORMAL HIGH (ref 0.0–0.2)

## 2024-06-05 LAB — BASIC METABOLIC PANEL WITH GFR
Anion gap: 12 (ref 5–15)
BUN: 7 mg/dL (ref 6–20)
CO2: 22 mmol/L (ref 22–32)
Calcium: 9.2 mg/dL (ref 8.9–10.3)
Chloride: 104 mmol/L (ref 98–111)
Creatinine, Ser: 0.62 mg/dL (ref 0.44–1.00)
GFR, Estimated: >60 mL/min (ref >60)
Glucose, Bld: 96 mg/dL (ref 70–99)
Potassium: 3.3 mmol/L — ABNORMAL LOW (ref 3.5–5.1)
Sodium: 138 mmol/L (ref 135–145)

## 2024-06-05 LAB — BPAM RBC
Blood Product Expiration Date: 202511022359
Blood Product Expiration Date: 202511032359
ISSUE DATE / TIME: 202510061700
ISSUE DATE / TIME: 202510062023
Unit Type and Rh: 5100
Unit Type and Rh: 5100

## 2024-06-05 LAB — GLUCOSE, CAPILLARY
Glucose-Capillary: 100 mg/dL — ABNORMAL HIGH (ref 70–99)
Glucose-Capillary: 97 mg/dL (ref 70–99)
Glucose-Capillary: 98 mg/dL (ref 70–99)
Glucose-Capillary: 99 mg/dL (ref 70–99)

## 2024-06-05 LAB — TYPE AND SCREEN
ABO/RH(D): O POS
Antibody Screen: NEGATIVE
Unit division: 0
Unit division: 0

## 2024-06-05 LAB — HEMOGLOBIN AND HEMATOCRIT, BLOOD
HCT: 27.1 % — ABNORMAL LOW (ref 36.0–46.0)
Hemoglobin: 8 g/dL — ABNORMAL LOW (ref 12.0–15.0)

## 2024-06-05 LAB — C DIFFICILE QUICK SCREEN W PCR REFLEX
C Diff antigen: NEGATIVE
C Diff interpretation: NOT DETECTED
C Diff toxin: NEGATIVE

## 2024-06-05 LAB — HIV ANTIBODY (ROUTINE TESTING W REFLEX): HIV Screen 4th Generation wRfx: NONREACTIVE

## 2024-06-05 SURGERY — IMAGING PROCEDURE, GI TRACT, INTRALUMINAL, VIA CAPSULE

## 2024-06-05 NOTE — Progress Notes (Signed)
  Transition of Care [facility]) Screening Note   Patient Details  Name: Karen Hebert Date of Birth: 11-04-1970   Transition of Care [facility]) CM/SW Contact:    Hoy DELENA Bigness, LCSW Phone Number: 06/05/2024, 9:29 AM    Transition of Care Department [facility]) has reviewed patient and no TOC needs have been identified at this time. We will continue to monitor patient advancement through interdisciplinary progression rounds. If new patient transition needs arise, please place a TOC consult.    06/05/24 0928  TOC Brief Assessment  Insurance and Status Reviewed  Patient has primary care physician Yes  Home environment has been reviewed Home w/ spouse  Prior level of function: Independent  Prior/Current Home Services No current home services  Social Drivers of Health Review SDOH reviewed no interventions necessary  Readmission risk has been reviewed Yes  Transition of care needs no transition of care needs at this time

## 2024-06-05 NOTE — Progress Notes (Signed)
 Progress Note   Patient: Karen Hebert FMW:[id number] DOB: [birth year] DOA: 06/04/2024     1 DOS: the patient was seen and examined on 06/05/2024   Brief hospital admission narrative: As per H&P written by Dr. Arthea on 03/04/2024  Carline Dura is a 53 y.o. female with medical history significant for intermittent rectal bleeding, constipation, mild OSA currently being evaluated for treatment and type 2 diabetes mellitus who presented to her primary care physician 4 days ago because of several weeks of dizziness and fatigue and hot flashes.  She also has had trouble with shortness of breath with exertion and some chest pain with exertion.  Her primary provider did blood work and and she was called 3 days ago and told that she needed a transfusion.  The plan was to try to arrange that as an outpatient but was not able to be done so the patient came into the ED.  She reports that she last had rectal bleeding about a month ago.   She has had watery diarrhea for the last 4 days which is unusual for her.  She also reports that she has lost about 15 pounds in the last month which is also unusual for her.  She has been on Mounjaro  or some GLP-1 medication for over a year and she has maybe lost 15 pounds in a year. Its unclear why she is losing so much more this month.   Usually her rectal bleeding starts with a bowel movement.  She will see bright red blood then after the bowel movement is over she will continue to bleed and even need pads for underwear protection.  She will sometimes bleed up to 3 or 4 days after the bowel movement.  Then it will stop on its own and she may go many weeks without this happening again.  No abdominal pain. She did have a colonoscopy 6 months ago and she tells me it was suggested that this may be hemorrhoidal bleeding. She was referred to a surgeon but declined hemorrhoidal surgery because of the possible complications. Emergency department patient was evaluated and found  to have a hemoglobin of 6.2.  Transfusion of 2 units of packed red blood cells have already been ordered by the ED provider.  She will be admitted to the medical service with a GI consultation.  Assessment and plan 1-acute blood loss anemia/bright red blood per rectum - Hemoglobin around 6.2 at time of admission - 2 unit PRBCs provided with improvement in her hemoglobin up to 8.1 - Overall feeling better and hemodynamically stable. - GI service has been consulted and will follow further recommendations. - Continue n.p.o. status for now.  2-type 2 diabetes mellitus - Follow CBG fluctuation and provide coverage with sliding scale as needed - Patient using Mounjaro  as an outpatient.  3-diarrhea for 4 days  - No abdominal pain, nausea or vomiting. - Stool studies requested - Continue supportive care - Follow GI service recommendations.  4-hypertension - Continue Norvasc .  5-hypokalemia - In the setting of GI losses - Replete electrolytes and follow trend. - Will check magnesium level.  6-overweight -Body mass index is 29.56 kg/m.  -Low-calorie diet and portion control discussed with patient.  Subjective:  No fever, no chest pain, currently no nausea vomiting.  Patient is hemodynamically stable.  Physical Exam: Vitals:   06/05/24 0642 06/05/24 0739 06/05/24 1154 06/05/24 1530  BP: (!) 147/83 (!) 151/89 129/82 (!) 147/100  Pulse: 69 71 67 65  Resp: 18 18 18  18  Temp: 98.4 F (36.9 C) 98.4 F (36.9 C) 98.5 F (36.9 C) 99.3 F (37.4 C)  TempSrc: Oral Oral Oral Axillary  SpO2: 97% 98% 98% 97%  Weight:  90.8 kg    Height:  5' 9 (1.753 m)     General exam: Alert, awake, oriented x 3; in no acute distress. Respiratory system: Clear to auscultation. Respiratory effort normal.  Good saturation on room air. Cardiovascular system:RRR. No murmurs, rubs, gallops. Gastrointestinal system: Abdomen is nondistended, soft and nontender. No organomegaly or masses felt. Normal bowel  sounds heard. Central nervous system:  No focal neurological deficits. Extremities: No C/C/E, +pedal pulses Skin: No rashes, lesions or ulcers Psychiatry: Judgement and insight appear normal. Mood & affect appropriate.    Data Reviewed: Basic metabolic panel: Sodium 138, potassium 3.3, chloride 104, bicarb 22, BUN 7, creatinine 0.61 GFR >60 C. difficile panel: C. difficile antigen/C. difficile toxin negative. CBC: WBCs 5.9, hemoglobin 8.1 and platelet count 364K  Family Communication: Husband at bedside.  Disposition: Status is: Observation The patient remains OBS appropriate and will d/c before 2 midnights.  Anticipating discharge back home once medically stable.  Time spent: 35 minutes  Author: Eric Nunnery, MD 06/05/2024 4:44 PM  For on call review [email or web address].

## 2024-06-05 NOTE — Plan of Care (Signed)
  Problem: Education: Goal: Ability to describe self-care measures that may prevent or decrease complications (Diabetes Survival Skills Education) will improve Outcome: Progressing   Problem: Coping: Goal: Ability to adjust to condition or change in health will improve Outcome: Progressing   Problem: Health Behavior/Discharge Planning: Goal: Ability to identify and utilize available resources and services will improve Outcome: Progressing   Problem: Coping: Goal: Ability to adjust to condition or change in health will improve Outcome: Progressing

## 2024-06-05 NOTE — Telephone Encounter (Unsigned)
 Copied from CRM #[id number]. Topic: Appointments - Scheduling Inquiry for Clinic >> Jun 05, 2024  3:23 PM Donee H wrote: Reason for CRM: [facility] called to state received order for patient for blood transfusion but unable to get her in until Nov. 14.

## 2024-06-05 NOTE — Telephone Encounter (Signed)
Forwarding to you :)

## 2024-06-05 NOTE — Consult Note (Signed)
 Gastroenterology Consult   Referring Provider: No ref. provider found Primary Care Physician:  Early, Camie BRAVO, NP Primary Gastroenterologist:  Carlin POUR. Cindie, DO  Patient ID: Karen Hebert; [id number]; 10-Jun-1971   Admit date: 06/04/2024  LOS: 1 day   Date of Consultation: 06/05/2024  Reason for Consultation:  profound IDA    History of Present Illness   Karen Hebert is a 53 y.o. female with history of DM, HTN, constipation/fecal impaction/stercoral colitis, hepatic steatosis on imaging, self reported possible Crohn's colitis but nothing on recent colonoscopy, external and internal hemorrhoids, GERD, recent diagnosis of IDA presenting to ED due to profound symptomatic IDA requiring blood transfusion. She was unable to obtain outpatient blood transfusion until next week so presented to ED for evaluation.  ED course: VSS. Hgb 6.2 (Hgb 6.6 on 05/31/24, Hgb 10.9 on 11/26/23, Hgb 12.5 on 01/13/23). Hct 22.4, MCV 62.6 (MCV 65 on 05/31/24, MCV 81.4 on 11/26/23), Plt 412, K 3.2, Glu 144, BUN 11, Cre 0.65, stool heme negative.   Today, K 3.3, Hgb 8 (after 2 units of prbcs).    GI consult:  Patient evaluated earlier this year for gerd, epigastric pain, rectal bleeding, hemorrhoids, chronic constipation, decline in Hgb to 10.9. She completed EGD and colonoscopy in 11/2023, see below. She did not complete celiac labs or iron studies at that time as recommended.   Seen by general surgery in May for consideration of hemorrhoidectomy due to significant bleeding history although had noted some decreased bleeding with better management of constipation. She opted against surgery due to risk, and potential for recurrent hemorrhoids.  She had endometrial ablation in her 30s due to severe menstrual bleeding.   She denies blood donation, restrictive diet or change in diet. She eats plenty of iron in her diet from plant and animal sources.   Recent outpatient labs with ferritin 4, TSH 0.772, Hgb  6.6. labs completed due to progressive fatigue, DOE, lightheadedness.  She notes prior issues with constipation completely resolved when she started metformin  for diabetes.  She was having regular soft bowel movements typically 2/day.  She notes at times she would still pass blood per rectum even with soft stool, felt like it was just from the force of passing a stool.  Sometimes will see large amounts of blood, ranging from dark red blood blood clots that come from the rectum.  Sometimes requiring application of pressure for several minutes to her external hemorrhoids to get bleeding to stop.  Often can go 2 to 3 weeks without passing any blood but then may have bleeding for 3 to 7 days.  She does feel there is significant amount of bleeding from her rectum.  Over the past 2 months she has noted a change in her bowel habits.  She is no longer having any solid stools.  All of her stools are watery.  Complains of nocturnal stools.  Denies any antibiotic use.  No travel.  No ill contacts.  No change in diet or medications.  She denies any blood in the stool for the past 3 weeks.  No melena.  Stools are typically postprandially.  Chronically she has had issues with eating dinner, if she eats too much or to have a she often has vomiting or diarrhea ever since getting her gallbladder out.  Seems to be more prevalent lately.  Has some abdominal cramping with her stools.  Denies heartburn.  Uses ibuprofen or Goody's powders about twice per week for leg pain at night.  EGD 11/2023: -LA Grade A reflux esophagitis with no bleeding s/p bx neg -gastritis s/p bx, neg h.pylori -normal duodenal bulb, first portion of duodenum and second portion of duodenum  Colonoscopy 11/2023: -hemorrhoids found on perianal exam -non-bleeding internal hemorrhoids -examined ileum normal -colon normal   Prior to Admission medications   Medication Sig Start Date End Date Taking? Authorizing Provider  albuterol (VENTOLIN HFA) 108 (90  Base) MCG/ACT inhaler Inhale 2 puffs into the lungs every 6 (six) hours as needed for wheezing or shortness of breath. 05/31/24  Yes Early, Sara E, NP  amLODipine  (NORVASC ) 5 MG tablet Take 2 tablets (10 mg total) by mouth daily. 01/04/24  Yes Early, Sara E, NP  cholecalciferol (VITAMIN D3) 25 MCG (1000 UNIT) tablet Take 1,000 Units by mouth daily.   Yes [provider]  metFORMIN  (GLUCOPHAGE -XR) 500 MG 24 hr tablet Take 3 tablets (1,500 mg total) by mouth daily. Take one at dinner may increase to 2 at dinner after 1 week then may increase to 3 at dinner after another week and no issues with the medicine. Patient taking differently: Take 1,500 mg by mouth daily. 02/16/24  Yes Early, Sara E, NP  Multiple Vitamin (MULTIVITAMIN WITH MINERALS) TABS tablet Take 1 tablet by mouth daily.   Yes [provider]  ondansetron  (ZOFRAN -ODT) 4 MG disintegrating tablet Take 1 tablet (4 mg total) by mouth every 8 (eight) hours as needed for nausea or vomiting. 01/04/24  Yes Early, Sara E, NP  pantoprazole  (PROTONIX ) 40 MG tablet Take 1 tablet (40 mg total) by mouth 2 (two) times daily. Patient taking differently: Take 40 mg by mouth every evening. 12/20/23 12/19/24 Yes Carver, Charles K, DO  tirzepatide  (MOUNJARO ) 10 MG/0.5ML Pen Inject 10 mg into the skin once a week. Patient taking differently: Inject 10 mg into the skin every Saturday. 04/16/24  Yes Early, Sara E, NP  traZODone (DESYREL) 50 MG tablet Take 0.5-2 tablets (25-100 mg total) by mouth at bedtime as needed for sleep. 05/31/24  Yes Early, Sara E, NP    Current Facility-Administered Medications  Medication Dose Route Frequency Provider Last Rate Last Admin   acetaminophen (TYLENOL) tablet 650 mg  650 mg Oral Q6H PRN Arthea Child, MD       Or   acetaminophen (TYLENOL) suppository 650 mg  650 mg Rectal Q6H PRN Claiborne, Claudia, MD       amLODipine  (NORVASC ) tablet 10 mg  10 mg Oral Daily Melvenia Motto, MD   10 mg at 06/04/24 1602    bisacodyl (DULCOLAX) EC tablet 5 mg  5 mg Oral Daily PRN Claiborne, Claudia, MD       insulin  aspart (novoLOG ) injection 0-6 Units  0-6 Units Subcutaneous TID WC Claiborne, Claudia, MD       ondansetron  (ZOFRAN ) tablet 4 mg  4 mg Oral Q6H PRN Arthea Child, MD       Or   ondansetron  (ZOFRAN ) injection 4 mg  4 mg Intravenous Q6H PRN Arthea Child, MD       pantoprazole  (PROTONIX ) injection 40 mg  40 mg Intravenous Q12H Melvenia Motto, MD   40 mg at 06/04/24 1604   traZODone (DESYREL) tablet 50 mg  50 mg Oral QHS PRN Arthea Child, MD       Current Outpatient Medications  Medication Sig Dispense Refill   albuterol (VENTOLIN HFA) 108 (90 Base) MCG/ACT inhaler Inhale 2 puffs into the lungs every 6 (six) hours as needed for wheezing or shortness of breath. 8.5 g 3  amLODipine  (NORVASC ) 5 MG tablet Take 2 tablets (10 mg total) by mouth daily. 180 tablet 3   cholecalciferol (VITAMIN D3) 25 MCG (1000 UNIT) tablet Take 1,000 Units by mouth daily.     metFORMIN  (GLUCOPHAGE -XR) 500 MG 24 hr tablet Take 3 tablets (1,500 mg total) by mouth daily. Take one at dinner may increase to 2 at dinner after 1 week then may increase to 3 at dinner after another week and no issues with the medicine. (Patient taking differently: Take 1,500 mg by mouth daily.) 270 tablet 1   Multiple Vitamin (MULTIVITAMIN WITH MINERALS) TABS tablet Take 1 tablet by mouth daily.     ondansetron  (ZOFRAN -ODT) 4 MG disintegrating tablet Take 1 tablet (4 mg total) by mouth every 8 (eight) hours as needed for nausea or vomiting. 30 tablet 2   pantoprazole  (PROTONIX ) 40 MG tablet Take 1 tablet (40 mg total) by mouth 2 (two) times daily. (Patient taking differently: Take 40 mg by mouth every evening.) 60 tablet 11   tirzepatide  (MOUNJARO ) 10 MG/0.5ML Pen Inject 10 mg into the skin once a week. (Patient taking differently: Inject 10 mg into the skin every Saturday.) 6 mL 1   traZODone (DESYREL) 50 MG tablet Take 0.5-2 tablets (25-100 mg  total) by mouth at bedtime as needed for sleep. 60 tablet 6    Allergies as of 06/04/2024 - Review Complete 06/04/2024  Allergen Reaction Noted   Penicillins Anaphylaxis 12/02/2020    Past Medical History:  Diagnosis Date   Acute left flank pain 01/06/2023   Chest pressure 02/17/2023   Crohn's colitis (HCC)    ????   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   Transaminasemia 01/13/2023    Past Surgical History:  Procedure Laterality Date   APPENDECTOMY     CHOLECYSTECTOMY     COLONOSCOPY N/A 12/20/2023   Procedure: COLONOSCOPY;  Surgeon: Cindie Carlin POUR, DO;  Location: [facility];  Service: Endoscopy;  Laterality: N/A;  1230pm, asa 2   ESOPHAGOGASTRODUODENOSCOPY N/A 12/20/2023   Procedure: EGD (ESOPHAGOGASTRODUODENOSCOPY);  Surgeon: Cindie Carlin POUR, DO;  Location: [facility];  Service: Endoscopy;  Laterality: N/A;    Family History  Adopted: Yes  Family history unknown: Yes    Social History   Socioeconomic History   Marital status: Married    Spouse name: Not on file   Number of children: Not on file   Years of education: Not on file   Highest education level: Not on file  Occupational History   Not on file  Tobacco Use   Smoking status: Never    Passive exposure: Never   Smokeless tobacco: Never  Vaping Use   Vaping status: Never Used  Substance and Sexual Activity   Alcohol use: Not Currently   Drug use: Not Currently   Sexual activity: Not on file  Other Topics Concern   Not on file  Social History Narrative   Not on file   Social Drivers of Health   Financial Resource Strain: Not on file  Food Insecurity: No Food Insecurity (01/13/2023)    Hunger Vital Sign    Worried About Running Out of Food in the Last Year: Never  true    Ran Out of Food in the Last Year: Never true  Transportation Needs: No Transportation Needs (01/13/2023)   PRAPARE - Administrator, Civil Service (Medical): No    Lack of Transportation (Non-Medical): No  Physical Activity: Not on file  Stress: Not on file  Social Connections: Not on file  Intimate Partner Violence: Not At Risk (01/13/2023)   Humiliation, Afraid, Rape, and Kick questionnaire    Fear of Current or Ex-Partner: No    Emotionally Abused: No    Physically Abused: No    Sexually Abused: No     Review of System:   General: Negative for anorexia, weight loss, fever, chills,   weakness. See hpi Eyes: Negative for vision changes.  ENT: Negative for hoarseness, difficulty swallowing , nasal congestion. CV: Negative for chest pain, angina, palpitations,  , peripheral edema. See hpi Respiratory: Negative for dyspnea at rest,   cough, sputum, wheezing. See pi GI: See history of present illness. GU:  Negative for dysuria, hematuria, urinary incontinence, urinary frequency, nocturnal urination.  MS: Negative for joint pain, low back pain.  Derm: Negative for rash or itching.  Neuro: Negative for weakness, abnormal sensation, seizure, frequent headaches, memory loss, confusion.  Psych: Negative for anxiety, depression, suicidal ideation, hallucinations.  Endo: Negative for unusual weight change.  Heme: Negative for bruising or bleeding. Allergy: Negative for rash or hives.      Physical Examination:   Vital signs in last 24 hours: Temp:  [98 F (36.7 C)-98.8 F (37.1 C)] 98 F (36.7 C) (10/06 2308) Pulse Rate:  [67-83] 71 (10/07 0201) Resp:  [12-24] 15 (10/07 0201) BP: (138-182)/(74-99) 138/74 (10/07 0201) SpO2:  [98 %-100 %] 100 % (10/07 0201) Weight:  [91.7 kg] 91.7 kg (10/06 1254) Last BM Date : 06/03/24  General: Well-nourished, well-developed in no acute distress.   Head: Normocephalic, atraumatic.   Eyes: Conjunctiva pink, no icterus. Mouth: Oropharyngeal mucosa moist and pink , no lesions erythema or exudate. Neck: Supple without thyromegaly, masses, or lymphadenopathy.  Lungs: Clear to auscultation bilaterally.  Heart: Regular rate and rhythm, no murmurs rubs or gallops.  Abdomen: Bowel sounds are normal, nontender, nondistended, no hepatosplenomegaly or masses, no abdominal bruits or hernia , no rebound or guarding.   Rectal: not performed Extremities: No lower extremity edema, clubbing, deformity.  Neuro: Alert and oriented x 4 , grossly normal neurologically.  Skin: Warm and dry, no rash or jaundice.   Psych: Alert and cooperative, normal mood and affect.        Intake/Output from previous day: 10/06 0701 - 10/07 0700 In: 1730 [I.V.:100; Blood:630; IV Piggyback:1000] Out: -  Intake/Output this shift: Total I/O In: 730 [I.V.:100; Blood:630] Out: -   Lab Results:   CBC Recent Labs    06/04/24 1350 06/05/24 0124  WBC 6.0  --   HGB 6.2* 8.0*  HCT 22.4* 27.1*  MCV 62.6*  --   PLT 412*  --    BMET Recent Labs    06/04/24 1350 06/05/24 0124  NA 140 138  K 3.2* 3.3*  CL 104 104  CO2 23 22  GLUCOSE 144* 96  BUN 11 7  CREATININE 0.65 0.62  CALCIUM 9.5 9.2   LFT Recent Labs    06/04/24 1350  BILITOT 0.2  ALKPHOS 99  AST 22  ALT 26  PROT 6.9  ALBUMIN 4.4    Lipase No results for input(s): LIPASE in the last 72 hours.  PT/INR Recent Labs  06/04/24 1350  LABPROT 12.3  INR 0.9     Hepatitis Panel No results for input(s): HEPBSAG, HCVAB, HEPAIGM, HEPBIGM in the last 72 hours.   Imaging Studies:   CT HEAD WO CONTRAST Result Date: 06/04/2024 CLINICAL DATA:  Headache, fatigue, radiating neck pain, no stated injury EXAM: CT HEAD WITHOUT CONTRAST CT CERVICAL SPINE WITHOUT CONTRAST TECHNIQUE: Multidetector CT imaging of the head and cervical spine was performed following the standard protocol without  intravenous contrast. Multiplanar CT image reconstructions of the cervical spine were also generated. RADIATION DOSE REDUCTION: This exam was performed according to the departmental dose-optimization program which includes automated exposure control, adjustment of the mA and/or kV according to patient size and/or use of iterative reconstruction technique. COMPARISON:  12/03/2021 FINDINGS: CT HEAD FINDINGS Brain: No evidence of acute infarction, hemorrhage, hydrocephalus, extra-axial collection or mass lesion/mass effect. Vascular: No hyperdense vessel or unexpected calcification. Skull: Normal. Negative for fracture or focal lesion. Sinuses/Orbits: No acute finding. Other: None. CT CERVICAL SPINE FINDINGS Alignment: Straightening of the normal cervical lordosis. Skull base and vertebrae: No acute fracture. No primary bone lesion or focal pathologic process. Soft tissues and spinal canal: No prevertebral fluid or swelling. No visible canal hematoma. Disc levels: Mild disc degenerative change at C5-C6 with otherwise generally preserved disc spaces. Upper chest: Negative. Other: None. IMPRESSION: 1. No acute intracranial pathology. 2. No fracture or static subluxation of the cervical spine. 3. Mild disc degenerative change at C5-C6 with otherwise generally preserved disc spaces. Electronically Signed   By: Marolyn JONETTA Jaksch M.D.   On: 06/04/2024 15:45   CT Cervical Spine Wo Contrast Result Date: 06/04/2024 CLINICAL DATA:  Headache, fatigue, radiating neck pain, no stated injury EXAM: CT HEAD WITHOUT CONTRAST CT CERVICAL SPINE WITHOUT CONTRAST TECHNIQUE: Multidetector CT imaging of the head and cervical spine was performed following the standard protocol without intravenous contrast. Multiplanar CT image reconstructions of the cervical spine were also generated. RADIATION DOSE REDUCTION: This exam was performed according to the departmental dose-optimization program which includes automated exposure control, adjustment  of the mA and/or kV according to patient size and/or use of iterative reconstruction technique. COMPARISON:  12/03/2021 FINDINGS: CT HEAD FINDINGS Brain: No evidence of acute infarction, hemorrhage, hydrocephalus, extra-axial collection or mass lesion/mass effect. Vascular: No hyperdense vessel or unexpected calcification. Skull: Normal. Negative for fracture or focal lesion. Sinuses/Orbits: No acute finding. Other: None. CT CERVICAL SPINE FINDINGS Alignment: Straightening of the normal cervical lordosis. Skull base and vertebrae: No acute fracture. No primary bone lesion or focal pathologic process. Soft tissues and spinal canal: No prevertebral fluid or swelling. No visible canal hematoma. Disc levels: Mild disc degenerative change at C5-C6 with otherwise generally preserved disc spaces. Upper chest: Negative. Other: None. IMPRESSION: 1. No acute intracranial pathology. 2. No fracture or static subluxation of the cervical spine. 3. Mild disc degenerative change at C5-C6 with otherwise generally preserved disc spaces. Electronically Signed   By: Marolyn JONETTA Jaksch M.D.   On: 06/04/2024 15:45  [4 week]  Assessment:   53 yo female with history of DM, HTN, constipation/fecal impaction/stercoral colitis (admission 12/2022), hepatic steatosis on imaging, per patient possible Crohn's colitis (never on medication and no evidence on last colonoscopy), hemorrhoids, GERD, recent diagnosis of IDA presenting to ED due to profound symptomatic IDA requiring blood transfusion. GI consulted for IDA/rectal bleeding.   Iron deficiency anemia/rectal bleeding: Anemia evolving over the past one year as outlined. She has had intermittent rectal bleeding due to hemorrhoids, at times heavy, requiring wearing pads  and applying pressure to her external hemorrhoids for several minutes. Bleeding had actually became less frequent over the past several months and can go weeks at a time without bleeding but then may have significant bleeding  with each BM for several days. Declined hemorrhoidectomy in May due to risk of complications and possibility of return of hemorrhoids. No menstrual losses. Cannot rule out occult gi bleeding from small bowel source. Need to screen for celiac disease.   -Hgb 12.5 (12/2022)-->10.9 (10/2023)-->6.6 (05/31/24)-->6.2(06/04/24)-->8.0 (after 2 units prbcs) -MCV 81.4 (10/2023)-->65 (05/31/24)-->62.6 (06/04/24) -ferritin 5 -stool hemoccult negative in ED, reported no stool present on DRE. She has intermittent heavy hemorrhoidal bleeding but has had decreased frequency, last bleeding 3 weeks ago -remote endometrial ablation in her 30s no menstrual losses -EGD/colonoscopy completed in 11/2023 with LA Grade A reflux esophagitis, gastritis, internal/external hemorrhoids -TTG IgA, serum IgA  Change in bowels/diarrhea: Diarrhea for 2 months, without notable change in medications or diet. No ill contacts, travel. No recent antibiotics. -check stools -check TTG IgA, IgA  Plan:   TTG IgA, IgA GI path, Cdiff Small bowel capsule endoscopy today. Keep NPO    LOS: 1 day   We would like to thank you for the opportunity to participate in the care of Karen Hebert.  Sonny RAMAN. Ezzard RIGGERS [facility] [phone] 10/7/20255:35 AM

## 2024-06-06 ENCOUNTER — Ambulatory Visit: Payer: Self-pay | Admitting: Gastroenterology

## 2024-06-06 ENCOUNTER — Telehealth (INDEPENDENT_AMBULATORY_CARE_PROVIDER_SITE_OTHER): Payer: Self-pay | Admitting: Gastroenterology

## 2024-06-06 DIAGNOSIS — D509 Iron deficiency anemia, unspecified: Secondary | ICD-10-CM | POA: Diagnosis not present

## 2024-06-06 DIAGNOSIS — E1159 Type 2 diabetes mellitus with other circulatory complications: Secondary | ICD-10-CM | POA: Diagnosis not present

## 2024-06-06 DIAGNOSIS — D62 Acute posthemorrhagic anemia: Secondary | ICD-10-CM | POA: Diagnosis not present

## 2024-06-06 DIAGNOSIS — K625 Hemorrhage of anus and rectum: Secondary | ICD-10-CM | POA: Diagnosis not present

## 2024-06-06 DIAGNOSIS — D649 Anemia, unspecified: Secondary | ICD-10-CM

## 2024-06-06 DIAGNOSIS — E1165 Type 2 diabetes mellitus with hyperglycemia: Secondary | ICD-10-CM | POA: Diagnosis not present

## 2024-06-06 LAB — BASIC METABOLIC PANEL WITH GFR
Anion gap: 12 (ref 5–15)
BUN: 6 mg/dL (ref 6–20)
CO2: 24 mmol/L (ref 22–32)
Calcium: 9.4 mg/dL (ref 8.9–10.3)
Chloride: 103 mmol/L (ref 98–111)
Creatinine, Ser: 0.65 mg/dL (ref 0.44–1.00)
GFR, Estimated: >60 mL/min (ref >60)
Glucose, Bld: 100 mg/dL — ABNORMAL HIGH (ref 70–99)
Potassium: 3.4 mmol/L — ABNORMAL LOW (ref 3.5–5.1)
Sodium: 139 mmol/L (ref 135–145)

## 2024-06-06 LAB — GASTROINTESTINAL PANEL BY PCR, STOOL (REPLACES STOOL CULTURE)

## 2024-06-06 LAB — GLUCOSE, CAPILLARY
Glucose-Capillary: 110 mg/dL — ABNORMAL HIGH (ref 70–99)
Glucose-Capillary: 117 mg/dL — ABNORMAL HIGH (ref 70–99)

## 2024-06-06 LAB — CBC
HCT: 29.2 % — ABNORMAL LOW (ref 36.0–46.0)
Hemoglobin: 8.4 g/dL — ABNORMAL LOW (ref 12.0–15.0)
MCH: 19 pg — ABNORMAL LOW (ref 26.0–34.0)
MCHC: 28.8 g/dL — ABNORMAL LOW (ref 30.0–36.0)
MCV: 66.2 fL — ABNORMAL LOW (ref 80.0–100.0)
Platelets: 339 K/uL (ref 150–400)
RBC: 4.41 MIL/uL (ref 3.87–5.11)
RDW: 22.3 % — ABNORMAL HIGH (ref 11.5–15.5)
WBC: 5.2 K/uL (ref 4.0–10.5)
nRBC: 0 % (ref 0.0–0.2)

## 2024-06-06 LAB — MAGNESIUM: Magnesium: 2.3 mg/dL (ref 1.7–2.4)

## 2024-06-06 LAB — TISSUE TRANSGLUTAMINASE, IGA: Tissue Transglutaminase Ab, IgA: <2 U/mL (ref 0–3)

## 2024-06-06 MED ORDER — PANTOPRAZOLE SODIUM 40 MG PO TBEC
40.0000 mg | DELAYED_RELEASE_TABLET | Freq: Two times a day (BID) | ORAL | 5 refills | Status: AC
Start: 2024-06-06 — End: 2025-06-06

## 2024-06-06 NOTE — Procedures (Signed)
 Small Bowel Givens Capsule Study Procedure date: 06/05/2024  Referring Provider:  Eric Nunnery, MD PCP:  Dr. Oris Camie BRAVO, NP  Indication for procedure:  hematochezia and iron deficiency anemia  Findings:  Study was adequate as capsule reached the cecum and the bowel preparation was adequate in the small bowel. No abnormalities were found in the gastrointestinal lining.     Summary & Recommendations: Capsule endoscopy was completely normal.  It is likely her rectal bleeding is coming from symptomatic internal and external hemorrhoids.  As she has not presented any overt bleeding for the last 3 weeks, will recommend close monitoring of recurrent bleeding as outpatient and if she were to present bleeding can perform an emergent CT angio versus stat capsule endoscopy as outpatient.  Will schedule follow-up as outpatient, she may benefit from banding of internal hemorrhoids as an adjuvant therapy for recurrent rectal bleeding.  -Okay to discharge home from GI perspective - If recurrent bleeding, proceed with stat CT angio versus capsule endoscopy - Outpatient follow-up in GI clinic, discuss internal hemorrhoidal banding  I personally communicated these recommendations to the patient  Karen Fortune, MD Gastroenterology and Hepatology [facility]

## 2024-06-06 NOTE — Plan of Care (Signed)
  Problem: Metabolic: Goal: Ability to maintain appropriate glucose levels will improve Outcome: Progressing   Problem: Clinical Measurements: Goal: Ability to maintain clinical measurements within normal limits will improve Outcome: Progressing   Problem: Pain Managment: Goal: General experience of comfort will improve and/or be controlled Outcome: Progressing   Problem: Safety: Goal: Ability to remain free from injury will improve Outcome: Progressing   Problem: Skin Integrity: Goal: Risk for impaired skin integrity will decrease Outcome: Progressing

## 2024-06-06 NOTE — Telephone Encounter (Signed)
 Called patient letting her know that the earliest availability I have for her transfusion is 06/11/24 at 9am. She stated she will call her Dr. Because its too far out. She did not call back, I tried calling patient several times, and left messages on VM.

## 2024-06-06 NOTE — Discharge Summary (Signed)
 Physician Discharge Summary   Patient: Karen Hebert MRN: [id number] DOB: [birth year]  Admit date:     06/04/2024  Discharge date: 06/06/24  Discharge Physician: Eric Nunnery   PCP: Oris Camie BRAVO, NP   Recommendations at discharge:  Repeat CBC to follow hemoglobin trend/stability Repeat basic metabolic panel to follow electrolytes and renal function Reassess blood pressure and adjust medication as needed Make sure patient follow-up with gastroenterology service as instructed.  Discharge Diagnoses: Principal Problem:   Acute blood loss anemia Active Problems:   Hypertension associated with diabetes (HCC)   Type 2 diabetes mellitus with hyperglycemia, without long-term current use of insulin  (HCC)   Symptomatic anemia  Brief hospital admission narrative: As per H&P written by Dr. Arthea on 03/04/2024  Karen Hebert is a 53 y.o. female with medical history significant for intermittent rectal bleeding, constipation, mild OSA currently being evaluated for treatment and type 2 diabetes mellitus who presented to her primary care physician 4 days ago because of several weeks of dizziness and fatigue and hot flashes.  She also has had trouble with shortness of breath with exertion and some chest pain with exertion.  Her primary provider did blood work and and she was called 3 days ago and told that she needed a transfusion.  The plan was to try to arrange that as an outpatient but was not able to be done so the patient came into the ED.  She reports that she last had rectal bleeding about a month ago.   She has had watery diarrhea for the last 4 days which is unusual for her.  She also reports that she has lost about 15 pounds in the last month which is also unusual for her.  She has been on Mounjaro  or some GLP-1 medication for over a year and she has maybe lost 15 pounds in a year. Its unclear why she is losing so much more this month.   Usually her rectal bleeding starts with a bowel  movement.  She will see bright red blood then after the bowel movement is over she will continue to bleed and even need pads for underwear protection.  She will sometimes bleed up to 3 or 4 days after the bowel movement.  Then it will stop on its own and she may go many weeks without this happening again.  No abdominal pain. She did have a colonoscopy 6 months ago and she tells me it was suggested that this may be hemorrhoidal bleeding. She was referred to a surgeon but declined hemorrhoidal surgery because of the possible complications. Emergency department patient was evaluated and found to have a hemoglobin of 6.2.  Transfusion of 2 units of packed red blood cells have already been ordered by the ED provider.  She will be admitted to the medical service with a GI consultation.  Assessment and Plan: 1-acute blood loss anemia/bright red blood per rectum - Hemoglobin around 6.2 at time of admission - 2 unit PRBCs provided with improvement in her hemoglobin up to 8.4 at time of discharge. - Overall feeling better and hemodynamically stable. - No overt bleeding appreciated; patient pursued capsule endoscopy evaluation which was normal and demonstrated no signs of acute bleeding. - Patient will continue PPI twice a day and will follow-up with gastroenterology service as an outpatient.   2-type 2 diabetes mellitus - Resume home hypoglycemic regimen - Patient advised to follow a modified carbohydrate diet - Outpatient follow-up with PCP to follow CBG fluctuation/A1c and further adjust  hypoglycemic regimen as needed.   3-diarrhea for 4 days  - No abdominal pain, nausea or vomiting. - Stool studies requested and demonstrated no signs of acute infection. - Patient advised to maintain adequate hydration and will continue patient follow-up with gastroenterology service.   4-hypertension - Continue Norvasc . -Heart healthy/low-sodium diet discussed with patient.   5-hypokalemia - In the setting of GI  losses - Electrolytes repleted and essentially within normal limits at discharge. -no abnormalities on telemetry and completely asymptomatic.  - Repeat basic metabolic panel to follow electrolytes trend/stability at follow-up visit.   6-overweight -Body mass index is 29.56 kg/m.  -Low-calorie diet and portion control discussed with patient.  Consultants: GI service Procedures performed: See below for x-ray report; capsule endoscopy. Disposition: Home Diet recommendation: Heart healthy/modified carbohydrate diet.  DISCHARGE MEDICATION: Allergies as of 06/06/2024       Reactions   Penicillins Anaphylaxis        Medication List     TAKE these medications    albuterol 108 (90 Base) MCG/ACT inhaler Commonly known as: VENTOLIN HFA Inhale 2 puffs into the lungs every 6 (six) hours as needed for wheezing or shortness of breath.   amLODipine  5 MG tablet Commonly known as: NORVASC  Take 2 tablets (10 mg total) by mouth daily.   cholecalciferol 25 MCG (1000 UNIT) tablet Commonly known as: VITAMIN D3 Take 1,000 Units by mouth daily.   metFORMIN  500 MG 24 hr tablet Commonly known as: GLUCOPHAGE -XR Take 3 tablets (1,500 mg total) by mouth daily. Take one at dinner may increase to 2 at dinner after 1 week then may increase to 3 at dinner after another week and no issues with the medicine. What changed: additional instructions   Mounjaro  10 MG/0.5ML Pen Generic drug: tirzepatide  Inject 10 mg into the skin once a week. What changed: when to take this   multivitamin with minerals Tabs tablet Take 1 tablet by mouth daily.   ondansetron  4 MG disintegrating tablet Commonly known as: ZOFRAN -ODT Take 1 tablet (4 mg total) by mouth every 8 (eight) hours as needed for nausea or vomiting.   pantoprazole  40 MG tablet Commonly known as: Protonix  Take 1 tablet (40 mg total) by mouth 2 (two) times daily. What changed: when to take this   traZODone 50 MG tablet Commonly known as:  DESYREL Take 0.5-2 tablets (25-100 mg total) by mouth at bedtime as needed for sleep.        Follow-up Information     Early, Camie BRAVO, NP. Schedule an appointment as soon as possible for a visit in 10 day(s).   Specialty: Nurse Practitioner Contact information: [address] [place] [address] [phone]         Eartha Angelia Sieving, MD Follow up.   Specialty: Gastroenterology Why: Office will contact you with appointment details. Contact information: [address] [place] [address] [phone]                Discharge Exam: Fredricka Weights   06/04/24 1254 06/05/24 0739  Weight: 91.7 kg 90.8 kg   General exam: Alert, awake, oriented x 3 Respiratory system: Clear to auscultation. Respiratory effort normal. Cardiovascular system:RRR. No murmurs, rubs, gallops. Gastrointestinal system: Abdomen is nondistended, soft and nontender. No organomegaly or masses felt. Normal bowel sounds heard. Central nervous system: Alert and oriented. No focal neurological deficits. Extremities: No C/C/E, +pedal pulses Skin: No rashes, lesions or ulcers Psychiatry: Judgement and insight appear normal. Mood & affect appropriate.  Condition at discharge: Stable and improved.  The results of significant diagnostics from this hospitalization (including imaging, microbiology, ancillary and laboratory) are listed below for reference.   Imaging Studies: CT HEAD WO CONTRAST Result Date: 06/04/2024 CLINICAL DATA:  Headache, fatigue, radiating neck pain, no stated injury EXAM: CT HEAD WITHOUT CONTRAST CT CERVICAL SPINE WITHOUT CONTRAST TECHNIQUE: Multidetector CT imaging of the head and cervical spine was performed following the standard protocol without intravenous contrast. Multiplanar CT image reconstructions of the cervical spine were also generated. RADIATION DOSE REDUCTION: This exam was performed according to the departmental dose-optimization program  which includes automated exposure control, adjustment of the mA and/or kV according to patient size and/or use of iterative reconstruction technique. COMPARISON:  12/03/2021 FINDINGS: CT HEAD FINDINGS Brain: No evidence of acute infarction, hemorrhage, hydrocephalus, extra-axial collection or mass lesion/mass effect. Vascular: No hyperdense vessel or unexpected calcification. Skull: Normal. Negative for fracture or focal lesion. Sinuses/Orbits: No acute finding. Other: None. CT CERVICAL SPINE FINDINGS Alignment: Straightening of the normal cervical lordosis. Skull base and vertebrae: No acute fracture. No primary bone lesion or focal pathologic process. Soft tissues and spinal canal: No prevertebral fluid or swelling. No visible canal hematoma. Disc levels: Mild disc degenerative change at C5-C6 with otherwise generally preserved disc spaces. Upper chest: Negative. Other: None. IMPRESSION: 1. No acute intracranial pathology. 2. No fracture or static subluxation of the cervical spine. 3. Mild disc degenerative change at C5-C6 with otherwise generally preserved disc spaces. Electronically Signed   By: Marolyn JONETTA Jaksch M.D.   On: 06/04/2024 15:45   CT Cervical Spine Wo Contrast Result Date: 06/04/2024 CLINICAL DATA:  Headache, fatigue, radiating neck pain, no stated injury EXAM: CT HEAD WITHOUT CONTRAST CT CERVICAL SPINE WITHOUT CONTRAST TECHNIQUE: Multidetector CT imaging of the head and cervical spine was performed following the standard protocol without intravenous contrast. Multiplanar CT image reconstructions of the cervical spine were also generated. RADIATION DOSE REDUCTION: This exam was performed according to the departmental dose-optimization program which includes automated exposure control, adjustment of the mA and/or kV according to patient size and/or use of iterative reconstruction technique. COMPARISON:  12/03/2021 FINDINGS: CT HEAD FINDINGS Brain: No evidence of acute infarction, hemorrhage,  hydrocephalus, extra-axial collection or mass lesion/mass effect. Vascular: No hyperdense vessel or unexpected calcification. Skull: Normal. Negative for fracture or focal lesion. Sinuses/Orbits: No acute finding. Other: None. CT CERVICAL SPINE FINDINGS Alignment: Straightening of the normal cervical lordosis. Skull base and vertebrae: No acute fracture. No primary bone lesion or focal pathologic process. Soft tissues and spinal canal: No prevertebral fluid or swelling. No visible canal hematoma. Disc levels: Mild disc degenerative change at C5-C6 with otherwise generally preserved disc spaces. Upper chest: Negative. Other: None. IMPRESSION: 1. No acute intracranial pathology. 2. No fracture or static subluxation of the cervical spine. 3. Mild disc degenerative change at C5-C6 with otherwise generally preserved disc spaces. Electronically Signed   By: Marolyn JONETTA Jaksch M.D.   On: 06/04/2024 15:45    Microbiology: Results for orders placed or performed during the hospital encounter of 06/04/24  Gastrointestinal Panel by PCR , Stool     Status: None   Collection Time: 06/05/24 12:54 PM   Specimen: STOOL  Result Value Ref Range Status   Campylobacter species NOT DETECTED NOT DETECTED Final   Plesimonas shigelloides NOT DETECTED NOT DETECTED Final   Salmonella species NOT DETECTED NOT DETECTED Final   Yersinia enterocolitica NOT DETECTED NOT DETECTED Final   Vibrio species NOT DETECTED NOT DETECTED Final  Vibrio cholerae NOT DETECTED NOT DETECTED Final   Enteroaggregative E coli (EAEC) NOT DETECTED NOT DETECTED Final   Enteropathogenic E coli (EPEC) NOT DETECTED NOT DETECTED Final   Enterotoxigenic E coli (ETEC) NOT DETECTED NOT DETECTED Final   Shiga like toxin producing E coli (STEC) NOT DETECTED NOT DETECTED Final   Shigella/Enteroinvasive E coli (EIEC) NOT DETECTED NOT DETECTED Final   Cryptosporidium NOT DETECTED NOT DETECTED Final   Cyclospora cayetanensis NOT DETECTED NOT DETECTED Final    Entamoeba histolytica NOT DETECTED NOT DETECTED Final   Giardia lamblia NOT DETECTED NOT DETECTED Final   Adenovirus F40/41 NOT DETECTED NOT DETECTED Final   Astrovirus NOT DETECTED NOT DETECTED Final   Norovirus GI/GII NOT DETECTED NOT DETECTED Final   Rotavirus A NOT DETECTED NOT DETECTED Final   Sapovirus (I, II, IV, and V) NOT DETECTED NOT DETECTED Final    Comment: Performed at [facility], [address] [place], [place] [address]  C Difficile Quick Screen w PCR reflex     Status: None   Collection Time: 06/05/24 12:54 PM   Specimen: STOOL  Result Value Ref Range Status   C Diff antigen NEGATIVE NEGATIVE Final   C Diff toxin NEGATIVE NEGATIVE Final   C Diff interpretation No C. difficile detected.  Final    Comment: Performed at [facility], [address] [place], [place] [address]    Labs: CBC: Recent Labs  Lab 05/31/24 1006 06/04/24 1350 06/05/24 0124 06/05/24 1308 06/06/24 0617  WBC 5.8 6.0  --  5.9 5.2  NEUTROABS 3.5 3.8  --   --   --   HGB 6.6* 6.2* 8.0* 8.1* 8.4*  HCT 24.9* 22.4* 27.1* 28.2* 29.2*  MCV 65* 62.6*  --  66.4* 66.2*  PLT 386 412*  --  364 339   Basic Metabolic Panel: Recent Labs  Lab 05/31/24 1006 06/04/24 1350 06/05/24 0124 06/06/24 0617  NA 139 140 138 139  K 3.9 3.2* 3.3* 3.4*  CL 104 104 104 103  CO2 18* 23 22 24   GLUCOSE 98 144* 96 100*  BUN 12 11 7 6   CREATININE 0.62 0.65 0.62 0.65  CALCIUM 9.4 9.5 9.2 9.4  MG  --  2.0  --  2.3   Liver Function Tests: Recent Labs  Lab 05/31/24 1006 06/04/24 1350  AST 30 22  ALT 31 26  ALKPHOS 109 99  BILITOT 0.3 0.2  PROT 6.9 6.9  ALBUMIN 4.6 4.4   CBG: Recent Labs  Lab 06/05/24 1222 06/05/24 1711 06/05/24 2116 06/06/24 0726 06/06/24 1105  GLUCAP 97 98 99 110* 117*    Discharge time spent:  35 minutes.  Signed: Eric Nunnery, MD [facility] 06/06/2024

## 2024-06-06 NOTE — Progress Notes (Signed)
 Toribio Fortune, M.D. Gastroenterology & Hepatology   Interval History:  Patient reports feeling well and denies any complaint.  No more episodes of rectal bleeding.  No abdominal pain, nausea, vomiting, abdominal distention, melena or hematochezia. Hemoglobin remained stable at 8.4 value.  Inpatient Medications:  Current Facility-Administered Medications:    acetaminophen (TYLENOL) tablet 650 mg, 650 mg, Oral, Q6H PRN, 650 mg at 06/05/24 1943 **OR** acetaminophen (TYLENOL) suppository 650 mg, 650 mg, Rectal, Q6H PRN, Claiborne, Claudia, MD   amLODipine  (NORVASC ) tablet 10 mg, 10 mg, Oral, Daily, Melvenia Motto, MD, 10 mg at 06/06/24 9072   bisacodyl (DULCOLAX) EC tablet 5 mg, 5 mg, Oral, Daily PRN, Claiborne, Claudia, MD   insulin  aspart (novoLOG ) injection 0-6 Units, 0-6 Units, Subcutaneous, TID WC, Claiborne, Claudia, MD   ondansetron  (ZOFRAN ) tablet 4 mg, 4 mg, Oral, Q6H PRN **OR** ondansetron  (ZOFRAN ) injection 4 mg, 4 mg, Intravenous, Q6H PRN, Claiborne, Claudia, MD   pantoprazole  (PROTONIX ) injection 40 mg, 40 mg, Intravenous, Q12H, Melvenia Motto, MD, 40 mg at 06/06/24 9072   traZODone (DESYREL) tablet 50 mg, 50 mg, Oral, QHS PRN, Claiborne, Claudia, MD   I/O    Intake/Output Summary (Last 24 hours) at 06/06/2024 1220 Last data filed at 06/06/2024 0600 Gross per 24 hour  Intake 360 ml  Output --  Net 360 ml     Physical Exam: Temp:  [98.1 F (36.7 C)-99.3 F (37.4 C)] 98.1 F (36.7 C) (10/08 0430) Pulse Rate:  [60-78] 60 (10/08 0430) Resp:  [18-20] 18 (10/08 0430) BP: (129-147)/(71-100) 129/71 (10/08 0430) SpO2:  [96 %-97 %] 97 % (10/08 0430)  Temp (24hrs), Avg:98.7 F (37.1 C), Min:98.1 F (36.7 C), Max:99.3 F (37.4 C)  GENERAL: The patient is AO x3, in no acute distress. HEENT: Head is normocephalic and atraumatic. EOMI are intact. Mouth is well hydrated and without lesions. NECK: Supple. No masses LUNGS: Clear to auscultation. No presence of rhonchi/wheezing/rales.  Adequate chest expansion HEART: RRR, normal s1 and s2. ABDOMEN: Soft, nontender, no guarding, no peritoneal signs, and nondistended. BS +. No masses. EXTREMITIES: Without any cyanosis, clubbing, rash, lesions or edema. NEUROLOGIC: AOx3, no focal motor deficit. SKIN: no jaundice, no rashes  Laboratory Data: CBC:     Component Value Date/Time   WBC 5.2 06/06/2024 0617   RBC 4.41 06/06/2024 0617   HGB 8.4 (L) 06/06/2024 0617   HGB 6.6 (LL) 05/31/2024 1006   HCT 29.2 (L) 06/06/2024 0617   HCT 24.9 (L) 05/31/2024 1006   PLT 339 06/06/2024 0617   PLT 386 05/31/2024 1006   MCV 66.2 (L) 06/06/2024 0617   MCV 65 (L) 05/31/2024 1006   MCH 19.0 (L) 06/06/2024 0617   MCHC 28.8 (L) 06/06/2024 0617   RDW 22.3 (H) 06/06/2024 0617   RDW 17.3 (H) 05/31/2024 1006   LYMPHSABS 1.6 06/04/2024 1350   LYMPHSABS 1.6 05/31/2024 1006   MONOABS 0.3 06/04/2024 1350   EOSABS 0.2 06/04/2024 1350   EOSABS 0.2 05/31/2024 1006   BASOSABS 0.0 06/04/2024 1350   BASOSABS 0.1 05/31/2024 1006   COAG:  Lab Results  Component Value Date   INR 0.9 06/04/2024    BMP:     Latest Ref Rng & Units 06/06/2024    6:17 AM 06/05/2024    1:24 AM 06/04/2024    1:50 PM  BMP  Glucose 70 - 99 mg/dL 899  96  855   BUN 6 - 20 mg/dL 6  7  11    Creatinine 0.44 - 1.00 mg/dL 9.34  0.62  0.65   Sodium 135 - 145 mmol/L 139  138  140   Potassium 3.5 - 5.1 mmol/L 3.4  3.3  3.2   Chloride 98 - 111 mmol/L 103  104  104   CO2 22 - 32 mmol/L 24  22  23    Calcium 8.9 - 10.3 mg/dL 9.4  9.2  9.5     HEPATIC:     Latest Ref Rng & Units 06/04/2024    1:50 PM 05/31/2024   10:06 AM 11/26/2023   12:04 AM  Hepatic Function  Total Protein 6.5 - 8.1 g/dL 6.9  6.9  7.4   Albumin 3.5 - 5.0 g/dL 4.4  4.6  3.9   AST 15 - 41 U/L 22  30  30    ALT 0 - 44 U/L 26  31  34   Alk Phosphatase 38 - 126 U/L 99  109  76   Total Bilirubin 0.0 - 1.2 mg/dL 0.2  0.3  0.5     CARDIAC: No results found for: CKTOTAL, CKMB, CKMBINDEX, TROPONINI     Imaging: I personally reviewed and interpreted the available labs, imaging and endoscopic files.   Assessment/Plan: 53 y.o. female with history of DM, HTN, constipation/fecal impaction/stercoral colitis, hepatic steatosis, external and internal hemorrhoids, GERD, who came to the hospital after presenting worsening severe anemia requiring blood transfusion.   Patient reported having intermittent episodes of rectal bleeding with last episode close to 3 weeks ago.  Has had previous endoscopic workup with EGD and colonoscopy that has been only abnormal for presence of internal and external hemorrhoids.  Upon admission he was found to have severe iron deficiency with hemoglobin of 6.6.  She was transfused 1 unit PRBC with adequate response.  Capsule endoscopy was performed yesterday and read today.  There was no presence of any endoluminal abnormalities or presence of blood in the small bowel.   It is likely her rectal bleeding is coming from symptomatic internal and external hemorrhoids.  As she has not presented any overt bleeding for the last 3 weeks, will recommend close monitoring of recurrent bleeding as outpatient and if she were to present bleeding can perform an emergent CT angio versus stat capsule endoscopy as outpatient.   Will schedule follow-up as outpatient, she may benefit from banding of internal hemorrhoids as an adjuvant therapy for recurrent rectal bleeding.  -Okay to discharge home from GI perspective - If recurrent bleeding, proceed with stat CT angio versus capsule endoscopy - Outpatient follow-up in GI clinic, discuss internal hemorrhoidal banding - GI service will sign-off, please call us  back if you have any more questions.  Toribio Fortune, MD Gastroenterology and Hepatology [facility]

## 2024-06-07 ENCOUNTER — Telehealth: Payer: Self-pay

## 2024-06-07 LAB — IGA: IgA: 133 mg/dL (ref 87–352)

## 2024-06-07 NOTE — Transitions of Care (Post Inpatient/ED Visit) (Signed)
   06/07/2024  Name: Chayna Surratt MRN: [id number] DOB: [birth year]  Today's TOC FU Call Status: Today's TOC FU Call Status:: Unsuccessful Call (1st Attempt) Unsuccessful Call (1st Attempt) Date: 06/07/24  Attempted to reach the patient regarding the most recent Inpatient/ED visit.  Follow Up Plan: Additional outreach attempts will be made to reach the patient to complete the Transitions of Care (Post Inpatient/ED visit) call.   Signature Julian Lemmings, LPN [facility] Nurse Health Advisor Direct Dial [phone]

## 2024-06-08 NOTE — Transitions of Care (Post Inpatient/ED Visit) (Signed)
   06/08/2024  Name: Karen Hebert MRN: [id number] DOB: [birth year]  Today's TOC FU Call Status: Today's TOC FU Call Status:: Unsuccessful Call (2nd Attempt) Unsuccessful Call (1st Attempt) Date: 06/07/24 Unsuccessful Call (2nd Attempt) Date: 06/08/24  Attempted to reach the patient regarding the most recent Inpatient/ED visit.  Follow Up Plan: Additional outreach attempts will be made to reach the patient to complete the Transitions of Care (Post Inpatient/ED visit) call.   Signature Julian Lemmings, LPN [facility] Nurse Health Advisor Direct Dial [phone]

## 2024-06-11 ENCOUNTER — Other Ambulatory Visit: Payer: Self-pay | Admitting: Nurse Practitioner

## 2024-06-11 ENCOUNTER — Encounter (HOSPITAL_COMMUNITY): Payer: Self-pay | Admitting: Gastroenterology

## 2024-06-11 ENCOUNTER — Inpatient Hospital Stay

## 2024-06-11 ENCOUNTER — Inpatient Hospital Stay: Attending: Nurse Practitioner | Admitting: Nurse Practitioner

## 2024-06-11 DIAGNOSIS — D509 Iron deficiency anemia, unspecified: Secondary | ICD-10-CM

## 2024-06-11 NOTE — Transitions of Care (Post Inpatient/ED Visit) (Signed)
   06/11/2024  Name: Karen Hebert MRN: [id number] DOB: 11-11-1970  Today's TOC FU Call Status: Today's TOC FU Call Status:: Unsuccessful Call (3rd Attempt) Unsuccessful Call (1st Attempt) Date: 06/07/24 Unsuccessful Call (2nd Attempt) Date: 06/08/24 Unsuccessful Call (3rd Attempt) Date: 06/11/24  Attempted to reach the patient regarding the most recent Inpatient/ED visit.  Follow Up Plan: No further outreach attempts will be made at this time. We have been unable to contact the patient.  Signature Julian Lemmings, LPN [facility] Nurse Health Advisor Direct Dial [phone]

## 2024-06-11 NOTE — Progress Notes (Deleted)
 New Hematology/Oncology Consult   Requesting MD: Camie Doing, NP  [phone]      Reason for Consult: Iron deficiency anemia  HPI: Karen Hebert is a 53 year old woman referred for evaluation of iron deficiency anemia.  She had a normal CBC 01/13/2023; ferritin 41, TIBC 373, iron 38.  On 11/16/2023 hemoglobin returned at 10.9, MCV 81, white count 23, platelet count 371,000.  On 05/31/2024 the hemoglobin returned at 6.6, MCV 65, white count 5.8, platelet count 386,000; TIBC 499, iron 9, saturation 2, ferritin 4.  She presented to the emergency department on 06/04/2024-hemoglobin returned at 6.2, MCV 62, white count 6, platelet count 412,000.  She was transfused 2 units of blood.  Colonoscopy 12/20/2023-hemorrhoids.  Upper endoscopy 12/20/2023-LA grade a reflux esophagitis with no bleeding; gastritis (stomach biopsy with gastric antral and oxyntic mucosa with no specific histopathologic changes, H pylori-like organisms not identified; GE junction biopsy with esophageal squamous and cardiac mucosa with no specific histopathologic changes, negative for intestinal metaplasia or dysplasia).     Past Medical History:  Diagnosis Date   Acute left flank pain 01/06/2023   Chest pressure 02/17/2023   Crohn's colitis (HCC)    ????   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   Transaminasemia 01/13/2023    Past Surgical History:  Procedure Laterality Date   APPENDECTOMY     CHOLECYSTECTOMY     COLONOSCOPY N/A 12/20/2023   Procedure: COLONOSCOPY;  Surgeon: Cindie Carlin POUR, DO;  [facility];  Service: Endoscopy;  Laterality: N/A;  1230pm, asa 2   ESOPHAGOGASTRODUODENOSCOPY N/A  12/20/2023   Procedure: EGD (ESOPHAGOGASTRODUODENOSCOPY);  Surgeon: Cindie Carlin POUR, DO;  [facility]: [facility];  Service: Endoscopy;  Laterality: N/A;   GIVENS CAPSULE STUDY N/A 06/05/2024   Procedure: IMAGING PROCEDURE, GI TRACT, INTRALUMINAL, VIA CAPSULE;  Surgeon: Eartha Flavors, Toribio, MD;  Location: [facility];  Service: Gastroenterology;  Laterality: N/A;     Current Outpatient Medications:    albuterol (VENTOLIN HFA) 108 (90 Base) MCG/ACT inhaler, Inhale 2 puffs into the lungs every 6 (six) hours as needed for wheezing or shortness of breath., Disp: 8.5 g, Rfl: 3   amLODipine  (NORVASC ) 5 MG tablet, Take 2 tablets (10 mg total) by mouth daily., Disp: 180 tablet, Rfl: 3   cholecalciferol (VITAMIN D3) 25 MCG (1000 UNIT) tablet, Take 1,000 Units by mouth daily., Disp: , Rfl:    metFORMIN  (GLUCOPHAGE -XR) 500 MG 24 hr tablet, Take 3 tablets (1,500 mg total) by mouth daily. Take one at dinner may increase to 2 at dinner after 1 week then may increase to 3 at dinner after another week and no issues with the medicine. (Patient taking differently: Take 1,500 mg by mouth daily.), Disp: 270 tablet, Rfl: 1   Multiple Vitamin (MULTIVITAMIN WITH MINERALS) TABS tablet, Take 1 tablet by mouth daily., Disp: , Rfl:    ondansetron  (ZOFRAN -ODT) 4 MG disintegrating tablet, Take 1 tablet (4 mg total) by mouth every 8 (eight) hours as needed for nausea or vomiting., Disp: 30 tablet, Rfl: 2   pantoprazole  (PROTONIX ) 40 MG tablet, Take 1 tablet (40 mg total) by mouth 2 (two) times daily., Disp: 60 tablet, Rfl: 5  tirzepatide  (MOUNJARO ) 10 MG/0.5ML Pen, Inject 10 mg into the skin once a week. (Patient taking differently: Inject 10 mg into the skin every Saturday.), Disp: 6 mL, Rfl: 1   traZODone (DESYREL) 50 MG tablet, Take 0.5-2 tablets (25-100 mg total) by mouth at bedtime as needed for sleep., Disp: 60 tablet, Rfl: 6:     Allergies  Allergen Reactions   Penicillins Anaphylaxis    FH:  SOCIAL  HISTORY:  Review of Systems:   Physical Exam:  There were no vitals taken for this visit.  HEENT: *** Lungs: *** Cardiac: *** Abdomen: *** GU: ***  Vascular: *** Lymph nodes: *** Neurologic: *** Skin: *** Musculoskeletal: ***  LABS:  No results for input(s): WBC, HGB, HCT, PLT in the last 72 hours.  No results for input(s): NA, K, CL, CO2, GLUCOSE, BUN, CREATININE, CALCIUM in the last 72 hours.    RADIOLOGY:  CT HEAD WO CONTRAST Result Date: 06/04/2024 CLINICAL DATA:  Headache, fatigue, radiating neck pain, no stated injury EXAM: CT HEAD WITHOUT CONTRAST CT CERVICAL SPINE WITHOUT CONTRAST TECHNIQUE: Multidetector CT imaging of the head and cervical spine was performed following the standard protocol without intravenous contrast. Multiplanar CT image reconstructions of the cervical spine were also generated. RADIATION DOSE REDUCTION: This exam was performed according to the departmental dose-optimization program which includes automated exposure control, adjustment of the mA and/or kV according to patient size and/or use of iterative reconstruction technique. COMPARISON:  12/03/2021 FINDINGS: CT HEAD FINDINGS Brain: No evidence of acute infarction, hemorrhage, hydrocephalus, extra-axial collection or mass lesion/mass effect. Vascular: No hyperdense vessel or unexpected calcification. Skull: Normal. Negative for fracture or focal lesion. Sinuses/Orbits: No acute finding. Other: None. CT CERVICAL SPINE FINDINGS Alignment: Straightening of the normal cervical lordosis. Skull base and vertebrae: No acute fracture. No primary bone lesion or focal pathologic process. Soft tissues and spinal canal: No prevertebral fluid or swelling. No visible canal hematoma. Disc levels: Mild disc degenerative change at C5-C6 with otherwise generally preserved disc spaces. Upper chest: Negative. Other: None. IMPRESSION: 1. No acute intracranial pathology. 2. No fracture or static  subluxation of the cervical spine. 3. Mild disc degenerative change at C5-C6 with otherwise generally preserved disc spaces. Electronically Signed   By: Marolyn JONETTA Jaksch M.D.   On: 06/04/2024 15:45   CT Cervical Spine Wo Contrast Result Date: 06/04/2024 CLINICAL DATA:  Headache, fatigue, radiating neck pain, no stated injury EXAM: CT HEAD WITHOUT CONTRAST CT CERVICAL SPINE WITHOUT CONTRAST TECHNIQUE: Multidetector CT imaging of the head and cervical spine was performed following the standard protocol without intravenous contrast. Multiplanar CT image reconstructions of the cervical spine were also generated. RADIATION DOSE REDUCTION: This exam was performed according to the departmental dose-optimization program which includes automated exposure control, adjustment of the mA and/or kV according to patient size and/or use of iterative reconstruction technique. COMPARISON:  12/03/2021 FINDINGS: CT HEAD FINDINGS Brain: No evidence of acute infarction, hemorrhage, hydrocephalus, extra-axial collection or mass lesion/mass effect. Vascular: No hyperdense vessel or unexpected calcification. Skull: Normal. Negative for fracture or focal lesion. Sinuses/Orbits: No acute finding. Other: None. CT CERVICAL SPINE FINDINGS Alignment: Straightening of the normal cervical lordosis. Skull base and vertebrae: No acute fracture. No primary bone lesion or focal pathologic process. Soft tissues and spinal canal: No prevertebral fluid or swelling. No visible canal hematoma. Disc levels: Mild disc degenerative change at C5-C6 with otherwise generally preserved disc spaces. Upper chest: Negative. Other: None. IMPRESSION: 1. No acute intracranial pathology. 2. No fracture or static subluxation  of the cervical spine. 3. Mild disc degenerative change at C5-C6 with otherwise generally preserved disc spaces. Electronically Signed   By: Marolyn JONETTA Jaksch M.D.   On: 06/04/2024 15:45    Assessment and Plan:   ***    Karen Ned,  NP 06/11/2024, 1:28 PM

## 2024-06-13 ENCOUNTER — Encounter (INDEPENDENT_AMBULATORY_CARE_PROVIDER_SITE_OTHER): Payer: Self-pay | Admitting: Gastroenterology

## 2024-06-13 NOTE — Telephone Encounter (Signed)
 SABRA

## 2024-06-19 LAB — OPHTHALMOLOGY REPORT-SCANNED

## 2024-07-02 ENCOUNTER — Encounter: Payer: Self-pay | Admitting: *Deleted

## 2024-07-02 ENCOUNTER — Other Ambulatory Visit: Payer: Self-pay | Admitting: Gastroenterology

## 2024-07-02 ENCOUNTER — Telehealth: Payer: Self-pay | Admitting: Gastroenterology

## 2024-07-02 DIAGNOSIS — K625 Hemorrhage of anus and rectum: Secondary | ICD-10-CM

## 2024-07-02 NOTE — Telephone Encounter (Signed)
 Reach out by staff today as they reported that patient called in with complaint of rectal bleeding.  Reviewed her recent hospital stay, she underwent a capsule study given significant anemia secondary to rectal bleeding and capsule study was unremarkable.  Dr. Eartha felt as though her bleeding was most likely secondary to her hemorrhoids.  She has previously declined hemorrhoid surgery to address her internal and external hemorrhoids.  She needs to consider hemorrhoid banding to help with treatment of the internal hemorrhoids.  If external hemorrhoids are bleeding the only treatment options are surgical management and topical creams. Please use dot phrase below and obtain more information from the patient.   SABRARGARECTALBLEEDING   I recommend if she is having significant rectal bleeding that we get a STAT CBC and iron panel and a STAT CTA GI bleed scan.  I can see her either tomorrow or Thursday in urgent spot to perform anoscopy or if she wants to proceed with hemorrhoid banding then we can make that a banding appointment.   Charmaine Melia, MSN, APRN, FNP-BC, AGACNP-BC [facility]

## 2024-07-02 NOTE — Telephone Encounter (Signed)
 Spoke to pt, informed her of results and recommendations. Pt states she will have labs done on Wednesday and wait for results before she decides to do anything else.

## 2024-07-04 ENCOUNTER — Ambulatory Visit: Payer: Self-pay | Admitting: Gastroenterology

## 2024-07-04 ENCOUNTER — Other Ambulatory Visit (HOSPITAL_COMMUNITY)
Admission: RE | Admit: 2024-07-04 | Discharge: 2024-07-04 | Disposition: A | Source: Ambulatory Visit | Attending: Gastroenterology | Admitting: Gastroenterology

## 2024-07-04 ENCOUNTER — Ambulatory Visit (HOSPITAL_COMMUNITY)
Admission: RE | Admit: 2024-07-04 | Discharge: 2024-07-04 | Disposition: A | Discharge status: Home / Self Care | Source: Ambulatory Visit | Attending: Nurse Practitioner | Admitting: Nurse Practitioner

## 2024-07-04 DIAGNOSIS — R011 Cardiac murmur, unspecified: Secondary | ICD-10-CM | POA: Diagnosis present

## 2024-07-04 LAB — CBC
HCT: 32.6 % — ABNORMAL LOW (ref 36.0–46.0)
Hemoglobin: 9.4 g/dL — ABNORMAL LOW (ref 12.0–15.0)
MCH: 19.4 pg — ABNORMAL LOW (ref 26.0–34.0)
MCHC: 28.8 g/dL — ABNORMAL LOW (ref 30.0–36.0)
MCV: 67.4 fL — ABNORMAL LOW (ref 80.0–100.0)
Platelets: 446 K/uL — ABNORMAL HIGH (ref 150–400)
RBC: 4.84 MIL/uL (ref 3.87–5.11)
RDW: 24.8 % — ABNORMAL HIGH (ref 11.5–15.5)
WBC: 6.8 K/uL (ref 4.0–10.5)
nRBC: 0 % (ref 0.0–0.2)

## 2024-07-04 LAB — IRON AND TIBC
Iron: 39 ug/dL (ref 28–170)
Saturation Ratios: 8 % — ABNORMAL LOW (ref 10.4–31.8)
TIBC: 514 ug/dL — ABNORMAL HIGH (ref 250–450)
UIBC: 475 ug/dL

## 2024-07-04 LAB — ECHOCARDIOGRAM COMPLETE
Area-P 1/2: 3.48 cm2
S' Lateral: 2.8 cm

## 2024-07-04 LAB — FERRITIN: Ferritin: 6 ng/mL — ABNORMAL LOW (ref 11–307)

## 2024-07-04 NOTE — Progress Notes (Signed)
*  PRELIMINARY RESULTS* Echocardiogram 2D Echocardiogram has been performed.  Bernis Brisker 09/14/2023, 9:13 AM

## 2024-07-10 ENCOUNTER — Ambulatory Visit: Payer: Self-pay | Admitting: Nurse Practitioner

## 2024-07-13 ENCOUNTER — Encounter: Payer: Self-pay | Admitting: Nurse Practitioner

## 2024-07-13 NOTE — Progress Notes (Deleted)
 Covid: PAP: Mammo:

## 2024-07-24 ENCOUNTER — Inpatient Hospital Stay: Admitting: Gastroenterology

## 2024-07-24 NOTE — Progress Notes (Deleted)
 GI Office Note    Referring Provider: Early, Camie BRAVO, NP Primary Care Physician:  Oris Camie BRAVO, NP Primary Gastroenterologist: *** Date:  07/24/2024  ID:  Karen Hebert, DOB 04-Feb-1971, MRN [id number]   Chief Complaint   No chief complaint on file.  History of Present Illness  Karen Hebert is a 53 y.o. female with a history of *** presenting today with complaint of   Hospitalization in May 2024, seen by sonny kerns, PA-C. Seen for constipation and rectal bleedng. External hemorrhoids swollen and bleeding post enema. Concern for stercoral colitis on CT. She also presented with elevated LFTs. CT with patchy fatty infiltration of the liver. Denied herbal medications. Viral hepatitis and HIV labs negative. She was given bowel prep for constipation as well as milk of molasses enema. Advised linzess  on discharge. Constipation likely secondary to medication (GLP-1). Failed many otc laxatives.    ED visit 11/25/23 for LUQ/epigastric pain, chest pain, SOB, and diaphoresis. She reported possible chrons diagnosis but no treatment. Reported chronic bloody stools with stringy thick mucus. Given dilaudid , zofran , protonix , GI cocktail! CT with normal stomach, bowel, liver, pancreas, spleen.   Last office visit 12/06/2023.***.  EGD 12/20/2023: - ***  Colonoscopy 12/20/2023: - ***   Seen by our team inpatient in early October given significant IDA.  She presented with a hemoglobin of 6.2 and was transfused 2 units of PRBCs.  She had low iron levels and stool was Hemoccult negative.  No stool on the DRE during this visit.  She also had reported a change in bowel habits from constipation to having diarrhea for 2 months.  Celiac labs were checked along with stool studies and givens capsule completed as outlined below.  Celiac labs are negative and stool studies were negative.  Givens capsule 06/05/2024: - Study complete and completely normal - Rectal bleeding suspected to be secondary to  internal and external hemorrhoids - Advised if recurrent bleeding then performing emergent CT angio versus a stat capsule endoscopy outpatient.  Labs in early November given significant amount of reported rectal bleeding actually showed improvement of her hemoglobin from 8.4-9.4 after her hospital discharge and she still have low ferritin at 6 and her iron level itself did improved to 39 from 9 however her iron saturation remains low at 8%.  On 07/06/2024 patient stated that given she is having fair amounts of intermittent rectal bleeding that is bright red in nature with clots and some intermittent pain that she would like to go ahead and speak with general surgery regarding removal of hemorrhoids.   Today:     Wt Readings from Last 6 Encounters:  06/05/24 200 lb 2.8 oz (90.8 kg)  05/31/24 202 lb 3.2 oz (91.7 kg)  01/05/24 206 lb (93.4 kg)  01/04/24 201 lb (91.2 kg)  12/20/23 203 lb (92.1 kg)  12/06/23 214 lb 6.4 oz (97.3 kg)    There is no height or weight on file to calculate BMI.   Current Outpatient Medications  Medication Sig Dispense Refill   albuterol  (VENTOLIN  HFA) 108 (90 Base) MCG/ACT inhaler Inhale 2 puffs into the lungs every 6 (six) hours as needed for wheezing or shortness of breath. 8.5 g 3   amLODipine  (NORVASC ) 5 MG tablet Take 2 tablets (10 mg total) by mouth daily. 180 tablet 3   cholecalciferol (VITAMIN D3) 25 MCG (1000 UNIT) tablet Take 1,000 Units by mouth daily.     metFORMIN  (GLUCOPHAGE -XR) 500 MG 24 hr tablet Take 3 tablets (1,500 mg  total) by mouth daily. Take one at dinner may increase to 2 at dinner after 1 week then may increase to 3 at dinner after another week and no issues with the medicine. (Patient taking differently: Take 1,500 mg by mouth daily.) 270 tablet 1   Multiple Vitamin (MULTIVITAMIN WITH MINERALS) TABS tablet Take 1 tablet by mouth daily.     ondansetron  (ZOFRAN -ODT) 4 MG disintegrating tablet Take 1 tablet (4 mg total) by mouth every 8  (eight) hours as needed for nausea or vomiting. 30 tablet 2   pantoprazole  (PROTONIX ) 40 MG tablet Take 1 tablet (40 mg total) by mouth 2 (two) times daily. 60 tablet 5   tirzepatide  (MOUNJARO ) 10 MG/0.5ML Pen Inject 10 mg into the skin once a week. (Patient taking differently: Inject 10 mg into the skin every Saturday.) 6 mL 1   traZODone  (DESYREL ) 50 MG tablet Take 0.5-2 tablets (25-100 mg total) by mouth at bedtime as needed for sleep. 60 tablet 6   No current facility-administered medications for this visit.    Past Medical History:  Diagnosis Date   Acute left flank pain 01/06/2023   Chest pressure 02/17/2023   Crohn's colitis (HCC)    ????   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   Transaminasemia 01/13/2023    Past Surgical History:  Procedure Laterality Date   APPENDECTOMY     CHOLECYSTECTOMY     COLONOSCOPY N/A 12/20/2023   Procedure: COLONOSCOPY;  Surgeon: Cindie Carlin POUR, DO;  [facility];  Service: Endoscopy;  Laterality: N/A;  1230pm, asa 2   ESOPHAGOGASTRODUODENOSCOPY N/A 12/20/2023   Procedure: EGD (ESOPHAGOGASTRODUODENOSCOPY);  Surgeon: Cindie Carlin POUR, DO;  [facility];  Service: Endoscopy;  Laterality: N/A;   GIVENS CAPSULE STUDY N/A 06/05/2024   Procedure: IMAGING PROCEDURE, GI TRACT, INTRALUMINAL, VIA CAPSULE;  Surgeon: Eartha Flavors, Toribio, MD;  Location: [facility];  Service: Gastroenterology;  Laterality: N/A;    Family History  Adopted: Yes  Family history unknown: Yes    Allergies as of 07/24/2024 - Review Complete 06/05/2024  Allergen Reaction Noted   Penicillins Anaphylaxis 12/02/2020    Social History    Socioeconomic History   Marital status: Married    Spouse name: Not on file   Number of children: Not on file   Years of education: Not on file   Highest education level: Not on file  Occupational History   Not on file  Tobacco Use   Smoking status: Never    Passive exposure: Never   Smokeless tobacco: Never  Vaping Use   Vaping status: Never Used  Substance and Sexual Activity   Alcohol use: Not Currently   Drug use: Not Currently   Sexual activity: Not on file  Other Topics Concern   Not on file  Social History Narrative   Not on file   Social Drivers of Health   Financial Resource Strain: Not on file  Food Insecurity: No Food Insecurity (06/05/2024)   Hunger Vital Sign    Worried About Running Out of Food in the Last Year: Never true    Ran Out of Food in the Last Year: Never true  Transportation  Needs: No Transportation Needs (06/05/2024)   PRAPARE - Administrator, Civil Service (Medical): No    Lack of Transportation (Non-Medical): No  Physical Activity: Not on file  Stress: Not on file  Social Connections: Not on file    Review of Systems   Gen: Denies fever, chills, anorexia. Denies fatigue, weakness, weight loss.  CV: Denies chest pain, palpitations, syncope, peripheral edema, and claudication. Resp: Denies dyspnea at rest, cough, wheezing, coughing up blood, and pleurisy. GI: See HPI Derm: Denies rash, itching, dry skin Psych: Denies depression, anxiety, memory loss, confusion. No homicidal or suicidal ideation.  Heme: Denies bruising, bleeding, and enlarged lymph nodes.  Physical Exam   There were no vitals taken for this visit.  General:   Alert and oriented. No distress noted. Pleasant and cooperative.  Head:  Normocephalic and atraumatic. Eyes:  Conjuctiva clear without scleral icterus. Mouth:  Oral mucosa pink and moist. Good dentition. No lesions. Lungs:  Clear to auscultation bilaterally. No wheezes, rales, or rhonchi. No  distress.  Heart:  S1, S2 present without murmurs appreciated.  Abdomen:  +BS, soft, non-tender and non-distended. No rebound or guarding. No HSM or masses noted. Rectal: *** Msk:  Symmetrical without gross deformities. Normal posture. Extremities:  Without edema. Neurologic:  Alert and  oriented x4 Psych:  Alert and cooperative. Normal mood and affect.  Assessment & Plan  Ed Rayson is a 53 y.o. female presenting today with ***   Follow up   ***Follow up ***    Charmaine Melia, MSN, FNP-BC, AGACNP-BC [facility]

## 2024-07-25 ENCOUNTER — Encounter: Payer: Self-pay | Admitting: Gastroenterology

## 2024-08-02 ENCOUNTER — Encounter: Payer: Self-pay | Admitting: Nurse Practitioner

## 2024-08-15 ENCOUNTER — Other Ambulatory Visit: Payer: Self-pay | Admitting: Nurse Practitioner

## 2024-08-24 ENCOUNTER — Telehealth: Payer: Self-pay

## 2024-08-24 NOTE — Telephone Encounter (Signed)
 error

## 2024-08-27 ENCOUNTER — Telehealth: Admitting: Nurse Practitioner

## 2024-08-27 ENCOUNTER — Encounter: Payer: Self-pay | Admitting: Nurse Practitioner

## 2024-08-27 VITALS — BP 120/76 | HR 78 | Wt 192.0 lb

## 2024-08-27 DIAGNOSIS — E1165 Type 2 diabetes mellitus with hyperglycemia: Secondary | ICD-10-CM

## 2024-08-27 DIAGNOSIS — F418 Other specified anxiety disorders: Secondary | ICD-10-CM | POA: Diagnosis not present

## 2024-08-27 DIAGNOSIS — E1159 Type 2 diabetes mellitus with other circulatory complications: Secondary | ICD-10-CM | POA: Diagnosis not present

## 2024-08-27 DIAGNOSIS — Z7984 Long term (current) use of oral hypoglycemic drugs: Secondary | ICD-10-CM | POA: Diagnosis not present

## 2024-08-27 DIAGNOSIS — I5189 Other ill-defined heart diseases: Secondary | ICD-10-CM | POA: Diagnosis not present

## 2024-08-27 DIAGNOSIS — I10 Essential (primary) hypertension: Secondary | ICD-10-CM

## 2024-08-27 DIAGNOSIS — I152 Hypertension secondary to endocrine disorders: Secondary | ICD-10-CM

## 2024-08-27 MED ORDER — ALPRAZOLAM 0.5 MG PO TABS: 0.5000 mg | ORAL_TABLET | Freq: Two times a day (BID) | ORAL | 2 refills | Status: AC | PRN

## 2024-08-27 MED ORDER — TIRZEPATIDE 12.5 MG/0.5ML ~~LOC~~ SOAJ: 12.5000 mg | SUBCUTANEOUS | 2 refills | Status: AC

## 2024-08-27 MED ORDER — HYDRALAZINE HCL 25 MG PO TABS: ORAL_TABLET | ORAL | 3 refills | Status: AC

## 2024-08-27 NOTE — Patient Instructions (Signed)
 Patient Visit Summary VISIT SUMMARY:  During your virtual visit, we discussed your recent echocardiogram results and addressed your elevated blood pressure, situational anxiety, diastolic dysfunction, and ongoing weight management.  YOUR PLAN:  -HYPERTENSION ASSOCIATED WITH TYPE 2 DIABETES MELLITUS: Hypertension, or high blood pressure, can be worsened by stress and anxiety. Your blood pressure recently spiked to 197/120 mmHg. We aim to keep it below 130/85 mmHg. Continue taking amlodipine  10 mg daily and use hydralazine  as needed for spikes, up to three times a day. Regularly monitor your blood pressure and report if it remains high.  -SITUATIONAL ANXIETY: Situational anxiety is anxiety triggered by specific events or situations. Your anxiety is likely contributing to your blood pressure spikes. You have been prescribed alprazolam  for acute anxiety, which you can take up to twice a day as needed. Start with half a dose to see how you tolerate it. This medication is not a daily use medication, so if you find that you need to take it more than once or twice a week, please let me know and we can adjust to better suit your needs.   -DIASTOLIC DYSFUNCTION, GRADE 2: Diastolic dysfunction means the heart has difficulty relaxing and filling with blood. Your condition is likely due to hypertension. There is no heart damage or failure, but we need to monitor your blood pressure to prevent worsening.   -OBESITY: Obesity is having excess body weight. You are managing your weight well with Mounjaro  and have been successful in losing weight. We have increased your Mounjaro  dosage to 12.5 mg to help you continue your progress. Keep monitoring your weight loss.  INSTRUCTIONS:  Please continue to monitor your blood pressure regularly and report any consistent elevations. Take your medications as prescribed and start with half a dose of alprazolam  to assess your tolerance. Follow up with us  if you have any concerns  or if your symptoms persist.

## 2024-08-27 NOTE — Assessment & Plan Note (Signed)
 Her recent echocardiogram showed Grade II diastolic dysfunction with mild left atrial dilation. We discussed that this means the heart is pumping as it should, but there are signs of stiffening of the muscle, which are likely caused by blood pressure elevations. She describes intense periods of increased stress causing both emotional and physical symptoms. Exacerbations revolve around incidents with her spouse, which can lead to days of symptoms of headache and anxiety along with signs of significant blood pressure elevation. We discussed how the anxiety can lead to the blood pressure elevation and the importance of keeping these levels under control for her mental and physical health.  We discussed the importance of strict blood pressure management with goal BP on average less than 130/85 to protect the heart. Given that her blood pressures are typically well controlled on amlodipine  10mg , I feel that improved control of anxiety/tension during periods of high stress are vital to ensure that blood pressure control remains stable. We discussed the use of alprazolam  as needed for moments of high stress exacerbation to see if control of the anxiety will help keep the BP normalized. If BP is not normalized with anxiety control, recommend taking hydralazine  25mg  up to every 8 hours for as long as BP remains higher than 150/85. We discussed that if her baseline BP starts to elevated, we can change the daily medication to better help control that. She is aware of the recommendations and medications and uses. She will continue to monitor her BP routinely and let me know if her baseline BP is trending up. She will also let me know if her anxiety related BP is not controlled with the new measures.   Orders:   ALPRAZolam  (XANAX ) 0.5 MG tablet; Take 1 tablet (0.5 mg total) by mouth 2 (two) times daily as needed for anxiety.   hydrALAZINE  (APRESOLINE ) 25 MG tablet; Take for BP higher than 150/85 up to every 8 hours as long  as BP remains elevated.

## 2024-08-27 NOTE — Progress Notes (Signed)
 Virtual Visit Encounter mychart visit.   I connected with  Karen Hebert on 08/27/2024 at 11:45 AM EST by secure video and audio telemedicine application. I verified that I am speaking with the correct person using two identifiers.   I introduced myself as a Publishing Rights Manager with the practice. The limitations of evaluation and management by telemedicine discussed with the patient and the availability of in person appointments. The patient expressed verbal understanding and consent to proceed.  Participating parties in this visit include: Myself and patient  The patient is: Patient Location: Home I am: Provider Location: Office/Clinic Subjective:    CC and HPI:   History of Present Illness Karen Hebert is a 53 year old female who presents for a virtual visit to discuss her echocardiogram results.  She has resumed taking amlodipine  10 mg daily after noticing her blood pressure rising again on a consistent basis. Since that time, her blood pressures are overall well controlled with the exception of periods of increased anxiety/tension/stress. She attributes these stressors to her current living situation and relationship dynamics with her spouse. Most recently, during one of these periods of stress, she experienced an elevated blood pressure, with a significant spike to 197/120 mmHg lasting several days before normalizing.   She reports the situational episodes lead to her feeling super anxious with racing heart and severe headaches. She does not have anxiety or high tension during other periods. She has never taken anything for anxiety.   She has a history of low hemoglobin and bleeding issues, which she manages with supplements. Despite these challenges, her overall health is better than before.  She is currently on Mounjaro  for weight loss and has been successful, reporting a weight of 192 pounds.  She is in the process of selling her house and plans to move back to [facility] to be closer to her grandchildren. Her husband will not be joining her, which she is hopeful will reduce some of stressors and tensions that have been negatively affecting her health.  Reviewed Echocardiogram from 07/04/2024. IMPRESSIONS   1. Left ventricular ejection fraction, by estimation, is 60 to 65%. The  left ventricle has normal function. The left ventricle has no regional  wall motion abnormalities. Left ventricular diastolic parameters are  consistent with Grade II diastolic  dysfunction (pseudonormalization). Elevated left atrial pressure.   2. Right ventricular systolic function is normal. The right ventricular  size is normal. Tricuspid regurgitation signal is inadequate for assessing  PA pressure.   3. Left atrial size was mildly dilated.   4. The mitral valve is grossly normal. Trivial mitral valve  regurgitation.   5. The aortic valve is tricuspid. Aortic valve regurgitation is not  visualized. No aortic stenosis is present.   6. The inferior vena cava is normal in size with greater than 50%  respiratory variability, suggesting right atrial pressure of 3 mmHg.    Past medical history, Surgical history, Family history not pertinant except as noted below, Social history, Allergies, and medications have been entered into the medical record, reviewed, and corrections made.   Review of Systems:  All review of systems negative except what is listed in the HPI  Objective:    Alert and oriented x 4 Speaking in clear sentences with no shortness of breath. No distress.  Impression and Recommendations:    Assessment & Plan Situational anxiety Hypertension associated with diabetes (HCC) Grade II diastolic dysfunction Her recent echocardiogram showed Grade II diastolic dysfunction with mild left atrial  dilation. We discussed that this means the heart is pumping as it should, but there are signs of stiffening of the muscle, which are likely caused by blood pressure  elevations. She describes intense periods of increased stress causing both emotional and physical symptoms. Exacerbations revolve around incidents with her spouse, which can lead to days of symptoms of headache and anxiety along with signs of significant blood pressure elevation. We discussed how the anxiety can lead to the blood pressure elevation and the importance of keeping these levels under control for her mental and physical health.  We discussed the importance of strict blood pressure management with goal BP on average less than 130/85 to protect the heart. Given that her blood pressures are typically well controlled on amlodipine  10mg , I feel that improved control of anxiety/tension during periods of high stress are vital to ensure that blood pressure control remains stable. We discussed the use of alprazolam  as needed for moments of high stress exacerbation to see if control of the anxiety will help keep the BP normalized. If BP is not normalized with anxiety control, recommend taking hydralazine  25mg  up to every 8 hours for as long as BP remains higher than 150/85. We discussed that if her baseline BP starts to elevated, we can change the daily medication to better help control that. She is aware of the recommendations and medications and uses. She will continue to monitor her BP routinely and let me know if her baseline BP is trending up. She will also let me know if her anxiety related BP is not controlled with the new measures.   Orders:   ALPRAZolam  (XANAX ) 0.5 MG tablet; Take 1 tablet (0.5 mg total) by mouth 2 (two) times daily as needed for anxiety.   hydrALAZINE  (APRESOLINE ) 25 MG tablet; Take for BP higher than 150/85 up to every 8 hours as long as BP remains elevated.  Type 2 diabetes mellitus with hyperglycemia, without long-term current use of insulin  (HCC) Excellent control with mounjaro . She has also had successful weight loss. She would like to increase to the 12.5mg  dose with her  next refill.  Orders:   tirzepatide  (MOUNJARO ) 12.5 MG/0.5ML Pen; Inject 12.5 mg into the skin once a week.     orders and follow up as documented in EMR I discussed the assessment and treatment plan with the patient. The patient was provided an opportunity to ask questions and all were answered. The patient agreed with the plan and demonstrated an understanding of the instructions.   The patient was advised to call back or seek an in-person evaluation if the symptoms worsen or if the condition fails to improve as anticipated.  Follow-Up: in 3 months  I provided 35 minutes of non-face-to-face interaction with this non face-to-face encounter including intake, same-day documentation, and chart review.   Camie CHARLENA Doing, NP , DNP, AGNP-c [facility]

## 2024-08-27 NOTE — Assessment & Plan Note (Signed)
 Excellent control with mounjaro . She has also had successful weight loss. She would like to increase to the 12.5mg  dose with her next refill.  Orders:   tirzepatide  (MOUNJARO ) 12.5 MG/0.5ML Pen; Inject 12.5 mg into the skin once a week.

## 2024-08-29 ENCOUNTER — Ambulatory Visit: Admitting: Nurse Practitioner

## 2024-09-14 ENCOUNTER — Telehealth: Payer: Self-pay

## 2024-09-14 NOTE — Telephone Encounter (Signed)
 Patient request RX be sent to [facility], [address] [place], [place] [address]  Trazodone  HCL tabs 50 mg

## 2024-09-17 ENCOUNTER — Other Ambulatory Visit: Payer: Self-pay | Admitting: Nurse Practitioner

## 2024-09-17 DIAGNOSIS — G4700 Insomnia, unspecified: Secondary | ICD-10-CM

## 2024-09-17 MED ORDER — TRAZODONE HCL 50 MG PO TABS: 25.0000 mg | ORAL_TABLET | Freq: Every evening | ORAL | 3 refills | Status: AC | PRN

## 2024-10-02 ENCOUNTER — Encounter: Admitting: Nurse Practitioner
